# Patient Record
Sex: Male | Born: 1990 | Race: White | Hispanic: No | Marital: Single | Smoking: Current every day smoker
Health system: Southern US, Community
[De-identification: ages and names within clinical notes are randomized; demographics above are authoritative.]

## PROBLEM LIST (undated history)

## (undated) DIAGNOSIS — G8929 Other chronic pain: Secondary | ICD-10-CM

## (undated) DIAGNOSIS — F4310 Post-traumatic stress disorder, unspecified: Secondary | ICD-10-CM

## (undated) DIAGNOSIS — M545 Low back pain, unspecified: Secondary | ICD-10-CM

## (undated) DIAGNOSIS — F329 Major depressive disorder, single episode, unspecified: Secondary | ICD-10-CM

## (undated) DIAGNOSIS — K219 Gastro-esophageal reflux disease without esophagitis: Secondary | ICD-10-CM

## (undated) DIAGNOSIS — F909 Attention-deficit hyperactivity disorder, unspecified type: Secondary | ICD-10-CM

## (undated) DIAGNOSIS — M1990 Unspecified osteoarthritis, unspecified site: Secondary | ICD-10-CM

## (undated) DIAGNOSIS — S069X9A Unspecified intracranial injury with loss of consciousness of unspecified duration, initial encounter: Secondary | ICD-10-CM

## (undated) DIAGNOSIS — F419 Anxiety disorder, unspecified: Secondary | ICD-10-CM

## (undated) DIAGNOSIS — F32A Depression, unspecified: Secondary | ICD-10-CM

## (undated) DIAGNOSIS — T8484XA Pain due to internal orthopedic prosthetic devices, implants and grafts, initial encounter: Secondary | ICD-10-CM

## (undated) DIAGNOSIS — Z9289 Personal history of other medical treatment: Secondary | ICD-10-CM

## (undated) DIAGNOSIS — F319 Bipolar disorder, unspecified: Secondary | ICD-10-CM

## (undated) HISTORY — PX: FRACTURE SURGERY: SHX138

---

## 2017-05-20 DIAGNOSIS — Z9289 Personal history of other medical treatment: Secondary | ICD-10-CM

## 2017-05-20 HISTORY — DX: Personal history of other medical treatment: Z92.89

## 2017-06-16 ENCOUNTER — Inpatient Hospital Stay (HOSPITAL_COMMUNITY)
Admission: EM | Admit: 2017-06-16 | Discharge: 2017-07-09 | DRG: 957 | Disposition: A | Discharge status: Home Health | Payer: Medicaid Other | Attending: General Surgery | Admitting: General Surgery

## 2017-06-16 ENCOUNTER — Inpatient Hospital Stay (HOSPITAL_COMMUNITY): Payer: Medicaid Other

## 2017-06-16 ENCOUNTER — Emergency Department (HOSPITAL_COMMUNITY): Payer: Medicaid Other

## 2017-06-16 DIAGNOSIS — T8119XA Other postprocedural shock, initial encounter: Secondary | ICD-10-CM | POA: Diagnosis not present

## 2017-06-16 DIAGNOSIS — S82012A Displaced osteochondral fracture of left patella, initial encounter for closed fracture: Secondary | ICD-10-CM | POA: Diagnosis present

## 2017-06-16 DIAGNOSIS — I471 Supraventricular tachycardia: Secondary | ICD-10-CM | POA: Diagnosis present

## 2017-06-16 DIAGNOSIS — S82841B Displaced bimalleolar fracture of right lower leg, initial encounter for open fracture type I or II: Secondary | ICD-10-CM | POA: Diagnosis present

## 2017-06-16 DIAGNOSIS — S92312A Displaced fracture of first metatarsal bone, left foot, initial encounter for closed fracture: Secondary | ICD-10-CM

## 2017-06-16 DIAGNOSIS — T17890A Other foreign object in other parts of respiratory tract causing asphyxiation, initial encounter: Secondary | ICD-10-CM | POA: Diagnosis not present

## 2017-06-16 DIAGNOSIS — S2241XA Multiple fractures of ribs, right side, initial encounter for closed fracture: Secondary | ICD-10-CM | POA: Diagnosis present

## 2017-06-16 DIAGNOSIS — J939 Pneumothorax, unspecified: Secondary | ICD-10-CM

## 2017-06-16 DIAGNOSIS — Z419 Encounter for procedure for purposes other than remedying health state, unspecified: Secondary | ICD-10-CM

## 2017-06-16 DIAGNOSIS — T148XXA Other injury of unspecified body region, initial encounter: Secondary | ICD-10-CM

## 2017-06-16 DIAGNOSIS — R402112 Coma scale, eyes open, never, at arrival to emergency department: Secondary | ICD-10-CM | POA: Diagnosis present

## 2017-06-16 DIAGNOSIS — R58 Hemorrhage, not elsewhere classified: Secondary | ICD-10-CM

## 2017-06-16 DIAGNOSIS — S060X9A Concussion with loss of consciousness of unspecified duration, initial encounter: Secondary | ICD-10-CM | POA: Diagnosis present

## 2017-06-16 DIAGNOSIS — M25462 Effusion, left knee: Secondary | ICD-10-CM | POA: Diagnosis not present

## 2017-06-16 DIAGNOSIS — S129XXA Fracture of neck, unspecified, initial encounter: Secondary | ICD-10-CM

## 2017-06-16 DIAGNOSIS — M2540 Effusion, unspecified joint: Secondary | ICD-10-CM

## 2017-06-16 DIAGNOSIS — I9589 Other hypotension: Secondary | ICD-10-CM

## 2017-06-16 DIAGNOSIS — S32409A Unspecified fracture of unspecified acetabulum, initial encounter for closed fracture: Secondary | ICD-10-CM | POA: Diagnosis present

## 2017-06-16 DIAGNOSIS — S52021B Displaced fracture of olecranon process without intraarticular extension of right ulna, initial encounter for open fracture type I or II: Secondary | ICD-10-CM | POA: Diagnosis present

## 2017-06-16 DIAGNOSIS — S12601A Unspecified nondisplaced fracture of seventh cervical vertebra, initial encounter for closed fracture: Secondary | ICD-10-CM | POA: Diagnosis present

## 2017-06-16 DIAGNOSIS — S12600A Unspecified displaced fracture of seventh cervical vertebra, initial encounter for closed fracture: Secondary | ICD-10-CM

## 2017-06-16 DIAGNOSIS — Y92410 Unspecified street and highway as the place of occurrence of the external cause: Secondary | ICD-10-CM | POA: Diagnosis not present

## 2017-06-16 DIAGNOSIS — S32811A Multiple fractures of pelvis with unstable disruption of pelvic ring, initial encounter for closed fracture: Secondary | ICD-10-CM | POA: Diagnosis present

## 2017-06-16 DIAGNOSIS — R0682 Tachypnea, not elsewhere classified: Secondary | ICD-10-CM

## 2017-06-16 DIAGNOSIS — S92332A Displaced fracture of third metatarsal bone, left foot, initial encounter for closed fracture: Secondary | ICD-10-CM | POA: Diagnosis present

## 2017-06-16 DIAGNOSIS — Z8709 Personal history of other diseases of the respiratory system: Secondary | ICD-10-CM

## 2017-06-16 DIAGNOSIS — S32129A Unspecified Zone II fracture of sacrum, initial encounter for closed fracture: Secondary | ICD-10-CM | POA: Diagnosis present

## 2017-06-16 DIAGNOSIS — S92352A Displaced fracture of fifth metatarsal bone, left foot, initial encounter for closed fracture: Secondary | ICD-10-CM | POA: Diagnosis present

## 2017-06-16 DIAGNOSIS — S92311A Displaced fracture of first metatarsal bone, right foot, initial encounter for closed fracture: Secondary | ICD-10-CM | POA: Diagnosis present

## 2017-06-16 DIAGNOSIS — J9601 Acute respiratory failure with hypoxia: Secondary | ICD-10-CM | POA: Diagnosis not present

## 2017-06-16 DIAGNOSIS — S270XXA Traumatic pneumothorax, initial encounter: Secondary | ICD-10-CM | POA: Diagnosis present

## 2017-06-16 DIAGNOSIS — D72829 Elevated white blood cell count, unspecified: Secondary | ICD-10-CM | POA: Diagnosis not present

## 2017-06-16 DIAGNOSIS — E871 Hypo-osmolality and hyponatremia: Secondary | ICD-10-CM | POA: Diagnosis not present

## 2017-06-16 DIAGNOSIS — J9811 Atelectasis: Secondary | ICD-10-CM | POA: Diagnosis not present

## 2017-06-16 DIAGNOSIS — S36039A Unspecified laceration of spleen, initial encounter: Secondary | ICD-10-CM | POA: Diagnosis present

## 2017-06-16 DIAGNOSIS — S92322A Displaced fracture of second metatarsal bone, left foot, initial encounter for closed fracture: Secondary | ICD-10-CM | POA: Diagnosis present

## 2017-06-16 DIAGNOSIS — X58XXXA Exposure to other specified factors, initial encounter: Secondary | ICD-10-CM | POA: Diagnosis not present

## 2017-06-16 DIAGNOSIS — S82832B Other fracture of upper and lower end of left fibula, initial encounter for open fracture type I or II: Secondary | ICD-10-CM | POA: Diagnosis present

## 2017-06-16 DIAGNOSIS — R402364 Coma scale, best motor response, obeys commands, 24 hours or more after hospital admission: Secondary | ICD-10-CM | POA: Diagnosis not present

## 2017-06-16 DIAGNOSIS — Z9889 Other specified postprocedural states: Secondary | ICD-10-CM

## 2017-06-16 DIAGNOSIS — F17210 Nicotine dependence, cigarettes, uncomplicated: Secondary | ICD-10-CM | POA: Diagnosis present

## 2017-06-16 DIAGNOSIS — Y838 Other surgical procedures as the cause of abnormal reaction of the patient, or of later complication, without mention of misadventure at the time of the procedure: Secondary | ICD-10-CM | POA: Diagnosis not present

## 2017-06-16 DIAGNOSIS — K5900 Constipation, unspecified: Secondary | ICD-10-CM | POA: Diagnosis not present

## 2017-06-16 DIAGNOSIS — E876 Hypokalemia: Secondary | ICD-10-CM | POA: Diagnosis not present

## 2017-06-16 DIAGNOSIS — R451 Restlessness and agitation: Secondary | ICD-10-CM | POA: Diagnosis not present

## 2017-06-16 DIAGNOSIS — J9690 Respiratory failure, unspecified, unspecified whether with hypoxia or hypercapnia: Secondary | ICD-10-CM

## 2017-06-16 DIAGNOSIS — J95811 Postprocedural pneumothorax: Secondary | ICD-10-CM | POA: Diagnosis not present

## 2017-06-16 DIAGNOSIS — S329XXA Fracture of unspecified parts of lumbosacral spine and pelvis, initial encounter for closed fracture: Secondary | ICD-10-CM

## 2017-06-16 DIAGNOSIS — S83242A Other tear of medial meniscus, current injury, left knee, initial encounter: Secondary | ICD-10-CM | POA: Diagnosis present

## 2017-06-16 DIAGNOSIS — Z09 Encounter for follow-up examination after completed treatment for conditions other than malignant neoplasm: Secondary | ICD-10-CM

## 2017-06-16 DIAGNOSIS — R402254 Coma scale, best verbal response, oriented, 24 hours or more after hospital admission: Secondary | ICD-10-CM | POA: Diagnosis not present

## 2017-06-16 DIAGNOSIS — E872 Acidosis, unspecified: Secondary | ICD-10-CM

## 2017-06-16 DIAGNOSIS — Z9689 Presence of other specified functional implants: Secondary | ICD-10-CM

## 2017-06-16 DIAGNOSIS — Z452 Encounter for adjustment and management of vascular access device: Secondary | ICD-10-CM

## 2017-06-16 DIAGNOSIS — I959 Hypotension, unspecified: Secondary | ICD-10-CM | POA: Diagnosis present

## 2017-06-16 DIAGNOSIS — T1490XA Injury, unspecified, initial encounter: Secondary | ICD-10-CM

## 2017-06-16 DIAGNOSIS — S93431A Sprain of tibiofibular ligament of right ankle, initial encounter: Secondary | ICD-10-CM | POA: Diagnosis present

## 2017-06-16 DIAGNOSIS — S92023A Displaced fracture of anterior process of unspecified calcaneus, initial encounter for closed fracture: Secondary | ICD-10-CM | POA: Diagnosis present

## 2017-06-16 DIAGNOSIS — R402144 Coma scale, eyes open, spontaneous, 24 hours or more after hospital admission: Secondary | ICD-10-CM | POA: Diagnosis not present

## 2017-06-16 DIAGNOSIS — F1111 Opioid abuse, in remission: Secondary | ICD-10-CM

## 2017-06-16 DIAGNOSIS — R4189 Other symptoms and signs involving cognitive functions and awareness: Secondary | ICD-10-CM

## 2017-06-16 DIAGNOSIS — Z938 Other artificial opening status: Secondary | ICD-10-CM

## 2017-06-16 DIAGNOSIS — D62 Acute posthemorrhagic anemia: Secondary | ICD-10-CM | POA: Diagnosis present

## 2017-06-16 DIAGNOSIS — S83207A Unspecified tear of unspecified meniscus, current injury, left knee, initial encounter: Secondary | ICD-10-CM | POA: Diagnosis present

## 2017-06-16 DIAGNOSIS — R402312 Coma scale, best motor response, none, at arrival to emergency department: Secondary | ICD-10-CM | POA: Diagnosis present

## 2017-06-16 DIAGNOSIS — Z23 Encounter for immunization: Secondary | ICD-10-CM | POA: Diagnosis not present

## 2017-06-16 DIAGNOSIS — S32009A Unspecified fracture of unspecified lumbar vertebra, initial encounter for closed fracture: Secondary | ICD-10-CM

## 2017-06-16 DIAGNOSIS — F1110 Opioid abuse, uncomplicated: Secondary | ICD-10-CM | POA: Diagnosis present

## 2017-06-16 DIAGNOSIS — R402212 Coma scale, best verbal response, none, at arrival to emergency department: Secondary | ICD-10-CM | POA: Diagnosis present

## 2017-06-16 DIAGNOSIS — S83412A Sprain of medial collateral ligament of left knee, initial encounter: Secondary | ICD-10-CM | POA: Diagnosis present

## 2017-06-16 DIAGNOSIS — R739 Hyperglycemia, unspecified: Secondary | ICD-10-CM | POA: Diagnosis not present

## 2017-06-16 DIAGNOSIS — Z978 Presence of other specified devices: Secondary | ICD-10-CM

## 2017-06-16 DIAGNOSIS — S93325A Dislocation of tarsometatarsal joint of left foot, initial encounter: Secondary | ICD-10-CM | POA: Diagnosis present

## 2017-06-16 DIAGNOSIS — S92342A Displaced fracture of fourth metatarsal bone, left foot, initial encounter for closed fracture: Secondary | ICD-10-CM | POA: Diagnosis present

## 2017-06-16 DIAGNOSIS — G8918 Other acute postprocedural pain: Secondary | ICD-10-CM

## 2017-06-16 DIAGNOSIS — S92323A Displaced fracture of second metatarsal bone, unspecified foot, initial encounter for closed fracture: Secondary | ICD-10-CM | POA: Diagnosis present

## 2017-06-16 DIAGNOSIS — S32810A Multiple fractures of pelvis with stable disruption of pelvic ring, initial encounter for closed fracture: Secondary | ICD-10-CM

## 2017-06-16 DIAGNOSIS — S82891C Other fracture of right lower leg, initial encounter for open fracture type IIIA, IIIB, or IIIC: Secondary | ICD-10-CM

## 2017-06-16 DIAGNOSIS — S86111A Strain of other muscle(s) and tendon(s) of posterior muscle group at lower leg level, right leg, initial encounter: Secondary | ICD-10-CM | POA: Diagnosis present

## 2017-06-16 LAB — BASIC METABOLIC PANEL
Anion gap: 10 (ref 5–15)
BUN: 12 mg/dL (ref 6–20)
CALCIUM: 8 mg/dL — AB (ref 8.9–10.3)
CO2: 25 mmol/L (ref 22–32)
CREATININE: 1.73 mg/dL — AB (ref 0.61–1.24)
Chloride: 101 mmol/L (ref 101–111)
GFR, EST AFRICAN AMERICAN: 26 mL/min — AB (ref >60)
GFR, EST NON AFRICAN AMERICAN: 23 mL/min — AB (ref >60)
Glucose, Bld: 175 mg/dL — ABNORMAL HIGH (ref 65–99)
Potassium: 4.3 mmol/L (ref 3.5–5.1)
SODIUM: 136 mmol/L (ref 135–145)

## 2017-06-16 LAB — I-STAT CHEM 8, ED
BUN: 14 mg/dL (ref 6–20)
CALCIUM ION: 1.07 mmol/L — AB (ref 1.15–1.40)
CHLORIDE: 96 mmol/L — AB (ref 101–111)
Creatinine, Ser: 1.7 mg/dL — ABNORMAL HIGH (ref 0.61–1.24)
Glucose, Bld: 176 mg/dL — ABNORMAL HIGH (ref 65–99)
HEMATOCRIT: 33 % — AB (ref 39.0–52.0)
Hemoglobin: 11.2 g/dL — ABNORMAL LOW (ref 13.0–17.0)
Potassium: 4.5 mmol/L (ref 3.5–5.1)
SODIUM: 136 mmol/L (ref 135–145)
TCO2: 26 mmol/L (ref 22–32)

## 2017-06-16 LAB — I-STAT CG4 LACTIC ACID, ED: LACTIC ACID, VENOUS: 4.18 mmol/L — AB (ref 0.5–1.9)

## 2017-06-16 LAB — CBC
HCT: 34.7 % — ABNORMAL LOW (ref 39.0–52.0)
HEMOGLOBIN: 12 g/dL — AB (ref 13.0–17.0)
MCH: 31.2 pg (ref 26.0–34.0)
MCHC: 34.6 g/dL (ref 30.0–36.0)
MCV: 90.1 fL (ref 78.0–100.0)
PLATELETS: 373 10*3/uL (ref 150–400)
RBC: 3.85 MIL/uL — ABNORMAL LOW (ref 4.22–5.81)
RDW: 14 % (ref 11.5–15.5)
WBC: 19.8 10*3/uL — ABNORMAL HIGH (ref 4.0–10.5)

## 2017-06-16 LAB — PROTIME-INR
INR: 1.37
PROTHROMBIN TIME: 16.8 s — AB (ref 11.4–15.2)

## 2017-06-16 LAB — TRIGLYCERIDES: Triglycerides: 96 mg/dL (ref <150)

## 2017-06-16 MED ORDER — CEFAZOLIN SODIUM-DEXTROSE 1-4 GM/50ML-% IV SOLN
1.0000 g | Freq: Three times a day (TID) | INTRAVENOUS | Status: DC
  Administered 2017-06-16 – 2017-06-17 (×2): 1 g via INTRAVENOUS
  Filled 2017-06-16 (×4): qty 50

## 2017-06-16 MED ORDER — ORAL CARE MOUTH RINSE
15.0000 mL | Freq: Four times a day (QID) | OROMUCOSAL | Status: DC
  Administered 2017-06-17 – 2017-06-18 (×3): 15 mL via OROMUCOSAL

## 2017-06-16 MED ORDER — DEXTROSE IN LACTATED RINGERS 5 % IV SOLN
INTRAVENOUS | Status: DC
  Administered 2017-06-17 – 2017-06-20 (×7): via INTRAVENOUS
  Administered 2017-06-21: 50 mL/h via INTRAVENOUS
  Administered 2017-06-23 – 2017-06-24 (×3): via INTRAVENOUS

## 2017-06-16 MED ORDER — CHLORHEXIDINE GLUCONATE 0.12% ORAL RINSE (MEDLINE KIT)
15.0000 mL | Freq: Two times a day (BID) | OROMUCOSAL | Status: DC
  Administered 2017-06-17 – 2017-06-20 (×8): 15 mL via OROMUCOSAL

## 2017-06-16 MED ORDER — MIDAZOLAM HCL 5 MG/5ML IJ SOLN
INTRAMUSCULAR | Status: AC | PRN
  Administered 2017-06-16: 4 mg via INTRAVENOUS

## 2017-06-16 MED ORDER — MIDAZOLAM HCL 2 MG/2ML IJ SOLN
INTRAMUSCULAR | Status: AC
  Administered 2017-06-16: 5 mg
  Filled 2017-06-16: qty 4

## 2017-06-16 MED ORDER — SUCCINYLCHOLINE CHLORIDE 20 MG/ML IJ SOLN
INTRAMUSCULAR | Status: AC | PRN
  Administered 2017-06-16: 120 mg via INTRAVENOUS

## 2017-06-16 MED ORDER — FENTANYL CITRATE (PF) 100 MCG/2ML IJ SOLN: 50.0000 ug | Freq: Once | INTRAMUSCULAR | Status: DC

## 2017-06-16 MED ORDER — MIDAZOLAM HCL 2 MG/2ML IJ SOLN
5.0000 mg | Freq: Once | INTRAMUSCULAR | Status: AC
  Administered 2017-06-17: 5 mg via INTRAVENOUS

## 2017-06-16 MED ORDER — MIDAZOLAM HCL 2 MG/2ML IJ SOLN
INTRAMUSCULAR | Status: AC
  Filled 2017-06-16: qty 6

## 2017-06-16 MED ORDER — FENTANYL 2500MCG IN NS 250ML (10MCG/ML) PREMIX INFUSION
0.0000 ug/h | INTRAVENOUS | Status: DC
  Administered 2017-06-17: 25 ug/h via INTRAVENOUS
  Administered 2017-06-18: 200 ug/h via INTRAVENOUS
  Administered 2017-06-18: 150 ug/h via INTRAVENOUS
  Administered 2017-06-19: 200 ug/h via INTRAVENOUS
  Administered 2017-06-19: 13:00:00 via INTRAVENOUS
  Administered 2017-06-20 (×2): 200 ug/h via INTRAVENOUS
  Filled 2017-06-16 (×7): qty 250

## 2017-06-16 MED ORDER — PROPOFOL 1000 MG/100ML IV EMUL
5.0000 ug/kg/min | INTRAVENOUS | Status: DC
  Administered 2017-06-16: 20 ug/kg/min via INTRAVENOUS

## 2017-06-16 MED ORDER — TETANUS-DIPHTH-ACELL PERTUSSIS 5-2.5-18.5 LF-MCG/0.5 IM SUSP
0.5000 mL | Freq: Once | INTRAMUSCULAR | Status: AC
  Administered 2017-06-16: 0.5 mL via INTRAMUSCULAR
  Filled 2017-06-16: qty 0.5

## 2017-06-16 MED ORDER — FENTANYL CITRATE (PF) 100 MCG/2ML IJ SOLN
INTRAMUSCULAR | Status: AC
  Administered 2017-06-16: 200 ug
  Filled 2017-06-16: qty 4

## 2017-06-16 MED ORDER — FENTANYL CITRATE (PF) 100 MCG/2ML IJ SOLN
200.0000 ug | Freq: Once | INTRAMUSCULAR | Status: AC
  Administered 2017-06-17: 200 ug via INTRAVENOUS

## 2017-06-16 MED ORDER — ROCURONIUM BROMIDE 50 MG/5ML IV SOLN
INTRAVENOUS | Status: AC | PRN
  Administered 2017-06-16: 50 mg via INTRAVENOUS

## 2017-06-16 MED ORDER — ETOMIDATE 2 MG/ML IV SOLN
INTRAVENOUS | Status: AC | PRN
  Administered 2017-06-16: 20 mg via INTRAVENOUS

## 2017-06-16 MED ORDER — PROPOFOL 1000 MG/100ML IV EMUL
INTRAVENOUS | Status: AC
  Filled 2017-06-16: qty 100

## 2017-06-16 NOTE — ED Notes (Signed)
 [facility]FP infusing via [facility]  Unit # [id number] [phone]

## 2017-06-16 NOTE — ED Notes (Signed)
 RBC infusing via [facility] Unit [phone]
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
## 2017-06-16 NOTE — ED Notes (Signed)
 RBC's infusing via [facility]  Unit # [id number] [phone]

## 2017-06-16 NOTE — ED Notes (Signed)
 RBC's infusing via [place]  Unit #[id number] [phone]

## 2017-06-16 NOTE — ED Notes (Signed)
 [facility]FP infusing via [facility]  Unit # [id number] [phone]

## 2017-06-16 NOTE — ED Provider Notes (Signed)
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 initial encounter  Displaced fracture of first metatarsal bone, left foot, initial encounter for closed fracture  Hypotension due to blood loss    New Prescriptions New Prescriptions   No medications on file     Mike Lento, MD 06/17/17 [id number]    Charlesetta Shanks, MD 07/02/17 [phone]

## 2017-06-16 NOTE — ED Notes (Signed)
Pt to CT

## 2017-06-16 NOTE — ED Notes (Signed)
Pelvic Binder applied by Dr. Derrell Lolling

## 2017-06-16 NOTE — ED Notes (Signed)
Pt moving all extremities in CT, see MAR

## 2017-06-16 NOTE — ED Notes (Signed)
 RBC's infusing via [facility] unit [phone] [id number]

## 2017-06-16 NOTE — Procedures (Signed)
Central Venous Catheter Insertion Procedure Note Orlie Pollen [id number] 10/20/1876  Procedure: Insertion of Central Venous Catheter Indications: Drug and/or fluid administration  Procedure Details Consent: Unable to obtain consent because of emergent medical necessity. Time Out: Verified patient identification, verified procedure, site/side was marked, verified correct patient position, special equipment/implants available, medications/allergies/relevent history reviewed, required imaging and test results available.  Performed  Maximum sterile technique was used including antiseptics and gloves. Skin prep: Iodine solution; local anesthetic administered A antimicrobial bonded/coated triple lumen catheter was placed in the right femoral vein due to emergent situation using the Seldinger technique.  Evaluation Blood flow good Complications: No apparent complications Patient did tolerate procedure well. Chest X-ray ordered to verify placement.  CXR: not needed.  Marigene Ehlers., Iretta Mangrum 06/16/2017, 10:41 PM

## 2017-06-16 NOTE — ED Notes (Signed)
 FFP infusing via [facility]   Unit #  [id number] [phone]

## 2017-06-16 NOTE — ED Notes (Signed)
Heat turned up in room 

## 2017-06-16 NOTE — ED Notes (Signed)
2nd liter NS bolus infusing

## 2017-06-16 NOTE — ED Notes (Signed)
 FFP's infusing via [facility]  Unit # [id number] [phone]

## 2017-06-16 NOTE — ED Notes (Signed)
Port x-ray at bedside at this time.  Pt remains unresponsive at this time

## 2017-06-16 NOTE — ED Notes (Signed)
Warm blankets applied to pt.  

## 2017-06-16 NOTE — Procedures (Signed)
Chest Tube Insertion Procedure Note  Indications:  Clinically significant hypotension/PTX  Pre-operative Diagnosis: Pneumothorax  Post-operative Diagnosis: Pneumothorax  Procedure Details  Informed consent was obtained for the procedure, including sedation.  Risks of lung perforation, hemorrhage, arrhythmia, and adverse drug reaction were discussed.   After sterile skin prep, using standard technique, a 36 French tube was placed in the right anterior 5 rib space.  Findings: None  Estimated Blood Loss:  Minimal         Specimens:  None              Complications:  None; patient tolerated the procedure well.         Disposition: ICU - intubated and critically ill.         Condition: stable  Attending Attestation: I performed the procedure.

## 2017-06-17 ENCOUNTER — Inpatient Hospital Stay (HOSPITAL_COMMUNITY): Payer: Medicaid Other | Admitting: Anesthesiology

## 2017-06-17 ENCOUNTER — Inpatient Hospital Stay (HOSPITAL_COMMUNITY): Payer: Medicaid Other

## 2017-06-17 ENCOUNTER — Encounter (HOSPITAL_COMMUNITY): Admission: EM | Disposition: A | Discharge status: Home Health | Payer: Self-pay | Source: Home / Self Care

## 2017-06-17 HISTORY — PX: ORIF ELBOW FRACTURE: SHX5031

## 2017-06-17 HISTORY — PX: EXTERNAL FIXATION LEG: SHX1549

## 2017-06-17 HISTORY — PX: PERCUTANEOUS PINNING: SHX2209

## 2017-06-17 LAB — TYPE AND SCREEN
ABO/RH(D): A POS
Antibody Screen: NEGATIVE
UNIT DIVISION: 0
UNIT DIVISION: 0
UNIT DIVISION: 0
UNIT DIVISION: 0
UNIT DIVISION: 0
UNIT DIVISION: 0
Unit division: 0
Unit division: 0
Unit division: 0

## 2017-06-17 LAB — BPAM RBC
BLOOD PRODUCT EXPIRATION DATE: 201809112359
BLOOD PRODUCT EXPIRATION DATE: 201809132359
BLOOD PRODUCT EXPIRATION DATE: 201809142359
BLOOD PRODUCT EXPIRATION DATE: 201809202359
Blood Product Expiration Date: 201809042359
Blood Product Expiration Date: 201809142359
Blood Product Expiration Date: 201809202359
Blood Product Expiration Date: 201809202359
Blood Product Expiration Date: 201809202359
ISSUE DATE / TIME: 201808282036
ISSUE DATE / TIME: 201808282107
ISSUE DATE / TIME: 201808291139
ISSUE DATE / TIME: 201808291139
ISSUE DATE / TIME: 201808282036
ISSUE DATE / TIME: 201808282107
UNIT TYPE AND RH: 6200
UNIT TYPE AND RH: 6200
UNIT TYPE AND RH: 6200
UNIT TYPE AND RH: 9500
Unit Type and Rh: 6200
Unit Type and Rh: 6200
Unit Type and Rh: 9500
Unit Type and Rh: 9500
Unit Type and Rh: 9500

## 2017-06-17 LAB — PREPARE FRESH FROZEN PLASMA
UNIT DIVISION: 0
Unit division: 0
Unit division: 0
Unit division: 0

## 2017-06-17 LAB — RAPID URINE DRUG SCREEN, HOSP PERFORMED
AMPHETAMINES: NOT DETECTED
BARBITURATES: NOT DETECTED
Benzodiazepines: POSITIVE — AB
Cocaine: NOT DETECTED
Opiates: POSITIVE — AB
TETRAHYDROCANNABINOL: NOT DETECTED

## 2017-06-17 LAB — BASIC METABOLIC PANEL
Anion gap: 7 (ref 5–15)
BUN: 11 mg/dL (ref 6–20)
CALCIUM: 7.2 mg/dL — AB (ref 8.9–10.3)
CO2: 23 mmol/L (ref 22–32)
CREATININE: 1.48 mg/dL — AB (ref 0.61–1.24)
Chloride: 109 mmol/L (ref 101–111)
GFR calc Af Amer: >60 mL/min (ref >60)
Glucose, Bld: 186 mg/dL — ABNORMAL HIGH (ref 65–99)
Potassium: 4 mmol/L (ref 3.5–5.1)
SODIUM: 139 mmol/L (ref 135–145)

## 2017-06-17 LAB — PREPARE RBC (CROSSMATCH)

## 2017-06-17 LAB — CBC
HEMATOCRIT: 29 % — AB (ref 39.0–52.0)
HEMOGLOBIN: 10.2 g/dL — AB (ref 13.0–17.0)
MCH: 29.8 pg (ref 26.0–34.0)
MCHC: 35.2 g/dL (ref 30.0–36.0)
MCV: 84.8 fL (ref 78.0–100.0)
Platelets: 145 10*3/uL — ABNORMAL LOW (ref 150–400)
RBC: 3.42 MIL/uL — AB (ref 4.22–5.81)
RDW: 15.5 % (ref 11.5–15.5)
WBC: 18.3 10*3/uL — ABNORMAL HIGH (ref 4.0–10.5)

## 2017-06-17 LAB — BPAM FFP
BLOOD PRODUCT EXPIRATION DATE: 201808312359
Blood Product Expiration Date: 201808292359
Blood Product Expiration Date: 201809122359
Blood Product Expiration Date: 201809172359
ISSUE DATE / TIME: 201808282036
ISSUE DATE / TIME: 201808282036
ISSUE DATE / TIME: 201808282108
ISSUE DATE / TIME: 201808282108
UNIT TYPE AND RH: 6200
UNIT TYPE AND RH: 6200
Unit Type and Rh: 6200
Unit Type and Rh: 6200

## 2017-06-17 LAB — POCT I-STAT 3, ART BLOOD GAS (G3+)
Acid-base deficit: 4 mmol/L — ABNORMAL HIGH (ref 0.0–2.0)
BICARBONATE: 22.1 mmol/L (ref 20.0–28.0)
O2 Saturation: 100 %
PO2 ART: 188 mmHg — AB (ref 83.0–108.0)
Patient temperature: 99.1
TCO2: 23 mmol/L (ref 22–32)
pCO2 arterial: 45.4 mmHg (ref 32.0–48.0)
pH, Arterial: 7.297 — ABNORMAL LOW (ref 7.350–7.450)

## 2017-06-17 LAB — BLOOD GAS, ARTERIAL
Acid-base deficit: 2.3 mmol/L — ABNORMAL HIGH (ref 0.0–2.0)
Acid-base deficit: 0.6 mmol/L (ref 0.0–2.0)
BICARBONATE: 22.4 mmol/L (ref 20.0–28.0)
Bicarbonate: 23.7 mmol/L (ref 20.0–28.0)
DRAWN BY: 301361
Drawn by: 30136
FIO2: 40
FIO2: 40
LHR: 14 {breaths}/min
LHR: 14 {breaths}/min
MECHVT: 600 mL
O2 SAT: 99.3 %
O2 Saturation: 99.1 %
PATIENT TEMPERATURE: 98.6
PCO2 ART: 40.6 mmHg (ref 32.0–48.0)
PEEP/CPAP: 5 cmH2O
PEEP: 5 cmH2O
PH ART: 7.385 (ref 7.350–7.450)
PO2 ART: 183 mmHg — AB (ref 83.0–108.0)
Patient temperature: 100
VT: 600 mL
pCO2 arterial: 42.9 mmHg (ref 32.0–48.0)
pH, Arterial: 7.342 — ABNORMAL LOW (ref 7.350–7.450)
pO2, Arterial: 182 mmHg — ABNORMAL HIGH (ref 83.0–108.0)

## 2017-06-17 LAB — ABO/RH: ABO/RH(D): A POS

## 2017-06-17 LAB — BLOOD PRODUCT ORDER (VERBAL) VERIFICATION

## 2017-06-17 LAB — SURGICAL PCR SCREEN
MRSA, PCR: NEGATIVE
Staphylococcus aureus: POSITIVE — AB

## 2017-06-17 LAB — PROTIME-INR
INR: 1.37
PROTHROMBIN TIME: 16.8 s — AB (ref 11.4–15.2)

## 2017-06-17 LAB — LACTIC ACID, PLASMA: LACTIC ACID, VENOUS: 2.9 mmol/L — AB (ref 0.5–1.9)

## 2017-06-17 LAB — MRSA PCR SCREENING: MRSA by PCR: NEGATIVE

## 2017-06-17 SURGERY — EXTERNAL FIXATION, LOWER EXTREMITY
Anesthesia: General | Site: Pelvis | Laterality: Right

## 2017-06-17 MED ORDER — PANTOPRAZOLE SODIUM 40 MG IV SOLR
40.0000 mg | INTRAVENOUS | Status: DC
  Administered 2017-06-17 – 2017-06-20 (×4): 40 mg via INTRAVENOUS
  Filled 2017-06-17 (×4): qty 40

## 2017-06-17 MED ORDER — CEFAZOLIN SODIUM-DEXTROSE 2-4 GM/100ML-% IV SOLN
INTRAVENOUS | Status: AC
  Filled 2017-06-17: qty 100

## 2017-06-17 MED ORDER — 0.9 % SODIUM CHLORIDE (POUR BTL) OPTIME
TOPICAL | Status: DC | PRN
  Administered 2017-06-17: 1000 mL

## 2017-06-17 MED ORDER — SODIUM CHLORIDE 0.9 % IV SOLN: Freq: Once | INTRAVENOUS | Status: DC

## 2017-06-17 MED ORDER — PHENYLEPHRINE 40 MCG/ML (10ML) SYRINGE FOR IV PUSH (FOR BLOOD PRESSURE SUPPORT)
PREFILLED_SYRINGE | INTRAVENOUS | Status: AC
  Filled 2017-06-17: qty 10

## 2017-06-17 MED ORDER — BACITRACIN ZINC 500 UNIT/GM EX OINT
TOPICAL_OINTMENT | CUTANEOUS | Status: AC
  Filled 2017-06-17: qty 28.35

## 2017-06-17 MED ORDER — ROCURONIUM BROMIDE 100 MG/10ML IV SOLN
INTRAVENOUS | Status: DC | PRN
  Administered 2017-06-17 (×4): 50 mg via INTRAVENOUS

## 2017-06-17 MED ORDER — SODIUM CHLORIDE 0.9 % IV SOLN: INTRAVENOUS | Status: DC | PRN

## 2017-06-17 MED ORDER — ALBUMIN HUMAN 5 % IV SOLN
25.0000 g | Freq: Once | INTRAVENOUS | Status: AC
  Administered 2017-06-17: 25 g via INTRAVENOUS
  Filled 2017-06-17: qty 500

## 2017-06-17 MED ORDER — ROCURONIUM BROMIDE 10 MG/ML (PF) SYRINGE
PREFILLED_SYRINGE | INTRAVENOUS | Status: AC
  Filled 2017-06-17: qty 5

## 2017-06-17 MED ORDER — SUGAMMADEX SODIUM 200 MG/2ML IV SOLN
INTRAVENOUS | Status: AC
  Filled 2017-06-17: qty 2

## 2017-06-17 MED ORDER — MIDAZOLAM HCL 2 MG/2ML IJ SOLN
INTRAMUSCULAR | Status: AC
  Filled 2017-06-17: qty 6

## 2017-06-17 MED ORDER — FENTANYL CITRATE (PF) 250 MCG/5ML IJ SOLN
INTRAMUSCULAR | Status: DC | PRN
  Administered 2017-06-17: 250 ug via INTRAVENOUS
  Administered 2017-06-17: 50 ug via INTRAVENOUS
  Administered 2017-06-17: 200 ug via INTRAVENOUS
  Administered 2017-06-17: 150 ug via INTRAVENOUS
  Administered 2017-06-17: 100 ug via INTRAVENOUS

## 2017-06-17 MED ORDER — SODIUM CHLORIDE 0.9 % IV SOLN: 10.0000 mL/h | Freq: Once | INTRAVENOUS | Status: DC

## 2017-06-17 MED ORDER — FENTANYL CITRATE (PF) 250 MCG/5ML IJ SOLN
INTRAMUSCULAR | Status: AC
  Filled 2017-06-17: qty 5

## 2017-06-17 MED ORDER — LIDOCAINE 2% (20 MG/ML) 5 ML SYRINGE
INTRAMUSCULAR | Status: AC
  Filled 2017-06-17: qty 5

## 2017-06-17 MED ORDER — SODIUM CHLORIDE 0.9 % IR SOLN
Status: DC | PRN
  Administered 2017-06-17 (×2): 3000 mL

## 2017-06-17 MED ORDER — CEFAZOLIN SODIUM-DEXTROSE 2-3 GM-% IV SOLR
INTRAVENOUS | Status: DC | PRN
  Administered 2017-06-17 (×2): 2 g via INTRAVENOUS

## 2017-06-17 MED ORDER — DEXTROSE 5 % IN LACTATED RINGERS IV BOLUS
1000.0000 mL | Freq: Once | INTRAVENOUS | Status: AC
  Administered 2017-06-17: 1000 mL via INTRAVENOUS

## 2017-06-17 MED ORDER — EPHEDRINE 5 MG/ML INJ
INTRAVENOUS | Status: AC
  Filled 2017-06-17: qty 10

## 2017-06-17 MED ORDER — MIDAZOLAM HCL 2 MG/2ML IJ SOLN
INTRAMUSCULAR | Status: DC | PRN
  Administered 2017-06-17: 2 mg via INTRAVENOUS

## 2017-06-17 MED ORDER — LACTATED RINGERS IV SOLN
INTRAVENOUS | Status: DC | PRN
  Administered 2017-06-17 (×2): via INTRAVENOUS

## 2017-06-17 MED ORDER — ONDANSETRON HCL 4 MG/2ML IJ SOLN
INTRAMUSCULAR | Status: AC
  Filled 2017-06-17: qty 2

## 2017-06-17 MED ORDER — CEFAZOLIN SODIUM-DEXTROSE 1-4 GM/50ML-% IV SOLN
1.0000 g | Freq: Three times a day (TID) | INTRAVENOUS | Status: AC
  Administered 2017-06-17 – 2017-06-19 (×6): 1 g via INTRAVENOUS
  Filled 2017-06-17 (×7): qty 50

## 2017-06-17 MED ORDER — VANCOMYCIN HCL 1000 MG IV SOLR
INTRAVENOUS | Status: AC
  Filled 2017-06-17: qty 1000

## 2017-06-17 MED ORDER — VANCOMYCIN HCL 1000 MG IV SOLR
INTRAVENOUS | Status: DC | PRN
  Administered 2017-06-17: 1000 mg via TOPICAL

## 2017-06-17 MED ORDER — FENTANYL CITRATE (PF) 250 MCG/5ML IJ SOLN
INTRAMUSCULAR | Status: AC
  Filled 2017-06-17: qty 10

## 2017-06-17 MED ORDER — ROCURONIUM BROMIDE 10 MG/ML (PF) SYRINGE
PREFILLED_SYRINGE | INTRAVENOUS | Status: AC
  Filled 2017-06-17: qty 10

## 2017-06-17 MED ORDER — MIDAZOLAM HCL 50 MG/10ML IJ SOLN
0.0000 mg/h | INTRAMUSCULAR | Status: DC
  Administered 2017-06-17 – 2017-06-18 (×2): 0.5 mg/h via INTRAVENOUS
  Filled 2017-06-17 (×3): qty 10

## 2017-06-17 MED ORDER — PHENYLEPHRINE HCL 10 MG/ML IJ SOLN
INTRAMUSCULAR | Status: DC | PRN
  Administered 2017-06-17: 120 ug via INTRAVENOUS
  Administered 2017-06-17: 80 ug via INTRAVENOUS

## 2017-06-17 MED ORDER — MIDAZOLAM HCL 2 MG/2ML IJ SOLN
INTRAMUSCULAR | Status: AC
  Filled 2017-06-17: qty 2

## 2017-06-17 MED ORDER — FENTANYL CITRATE (PF) 100 MCG/2ML IJ SOLN
INTRAMUSCULAR | Status: AC
  Filled 2017-06-17: qty 4

## 2017-06-17 SURGICAL SUPPLY — 143 items
BANDAGE ACE 3X5.8 VEL STRL LF (GAUZE/BANDAGES/DRESSINGS) ×6 IMPLANT
BANDAGE ACE 4X5 VEL STRL LF (GAUZE/BANDAGES/DRESSINGS) ×6 IMPLANT
BANDAGE ACE 6X5 VEL STRL LF (GAUZE/BANDAGES/DRESSINGS) ×6 IMPLANT
BANDAGE ELASTIC 4 VELCRO ST LF (GAUZE/BANDAGES/DRESSINGS) ×6 IMPLANT
BANDAGE ESMARK 6X9 LF (GAUZE/BANDAGES/DRESSINGS) ×5 IMPLANT
BENZOIN TINCTURE PRP APPL 2/3 (GAUZE/BANDAGES/DRESSINGS) IMPLANT
BIT DRILL 2.5X110 QC LCP DISP (BIT) ×6 IMPLANT
BIT DRILL QC 3.5X195 (BIT) ×6 IMPLANT
BLADE AVERAGE 25X9 (BLADE) ×6 IMPLANT
BLADE CLIPPER SURG (BLADE) ×6 IMPLANT
BLADE SURG 10 STRL SS (BLADE) IMPLANT
BLADE SURG 15 STRL LF DISP TIS (BLADE) ×5 IMPLANT
BLADE SURG 15 STRL SS (BLADE) ×1
BNDG COHESIVE 4X5 TAN STRL (GAUZE/BANDAGES/DRESSINGS) ×6 IMPLANT
BNDG COHESIVE 6X5 TAN STRL LF (GAUZE/BANDAGES/DRESSINGS) IMPLANT
BNDG ESMARK 4X9 LF (GAUZE/BANDAGES/DRESSINGS) ×6 IMPLANT
BNDG ESMARK 6X9 LF (GAUZE/BANDAGES/DRESSINGS) ×6
BNDG GAUZE ELAST 4 BULKY (GAUZE/BANDAGES/DRESSINGS) ×6 IMPLANT
BRUSH SCRUB SURG 4.25 DISP (MISCELLANEOUS) ×12 IMPLANT
CHLORAPREP W/TINT 26ML (MISCELLANEOUS) ×24 IMPLANT
CLAMP ADJUSTABLE (Clamp) ×12 IMPLANT
CLAMP LG COMBINATION (Clamp) ×12 IMPLANT
CLAMP LG MULTI PIN (Clamp) ×6 IMPLANT
CLAMP ROD ATTACHMENT (Clamp) ×6 IMPLANT
CLEANER TIP ELECTROSURG 2X2 (MISCELLANEOUS) ×6 IMPLANT
COVER SURGICAL LIGHT HANDLE (MISCELLANEOUS) ×12 IMPLANT
CUFF TOURNIQUET SINGLE 18IN (TOURNIQUET CUFF) IMPLANT
CUFF TOURNIQUET SINGLE 24IN (TOURNIQUET CUFF) IMPLANT
DECANTER SPIKE VIAL GLASS SM (MISCELLANEOUS) IMPLANT
DRAPE C-ARM 42X72 X-RAY (DRAPES) ×18 IMPLANT
DRAPE C-ARMOR (DRAPES) ×12 IMPLANT
DRAPE HALF SHEET 40X57 (DRAPES) ×6 IMPLANT
DRAPE IMP U-DRAPE 54X76 (DRAPES) ×12 IMPLANT
DRAPE INCISE IOBAN 66X45 STRL (DRAPES) ×6 IMPLANT
DRAPE LAPAROTOMY TRNSV 102X78 (DRAPE) ×6 IMPLANT
DRAPE U-SHAPE 47X51 STRL (DRAPES) ×18 IMPLANT
DRESSING PEEL AND PLC PRVNA 13 (GAUZE/BANDAGES/DRESSINGS) ×5 IMPLANT
DRSG ADAPTIC 3X8 NADH LF (GAUZE/BANDAGES/DRESSINGS) ×12 IMPLANT
DRSG EMULSION OIL 3X3 NADH (GAUZE/BANDAGES/DRESSINGS) IMPLANT
DRSG MEPITEL 4X7.2 (GAUZE/BANDAGES/DRESSINGS) IMPLANT
DRSG PAD ABDOMINAL 8X10 ST (GAUZE/BANDAGES/DRESSINGS) ×12 IMPLANT
DRSG PEEL AND PLACE PREVENA 13 (GAUZE/BANDAGES/DRESSINGS) ×6
ELECT REM PT RETURN 9FT ADLT (ELECTROSURGICAL) ×6
ELECTRODE REM PT RTRN 9FT ADLT (ELECTROSURGICAL) ×5 IMPLANT
EVACUATOR 1/8 PVC DRAIN (DRAIN) IMPLANT
FACESHIELD WRAPAROUND (MASK) IMPLANT
FLUID NSS /IRRIG 3000 ML XXX (IV SOLUTION) ×12 IMPLANT
GAUZE SPONGE 4X4 12PLY STRL (GAUZE/BANDAGES/DRESSINGS) ×6 IMPLANT
GAUZE SPONGE 4X4 12PLY STRL LF (GAUZE/BANDAGES/DRESSINGS) ×12 IMPLANT
GAUZE XEROFORM 1X8 LF (GAUZE/BANDAGES/DRESSINGS) ×6 IMPLANT
GAUZE XEROFORM 5X9 LF (GAUZE/BANDAGES/DRESSINGS) ×6 IMPLANT
GLOVE BIO SURGEON STRL SZ7.5 (GLOVE) ×18 IMPLANT
GLOVE BIO SURGEON STRL SZ8 (GLOVE) ×12 IMPLANT
GLOVE BIOGEL PI IND STRL 6.5 (GLOVE) ×25 IMPLANT
GLOVE BIOGEL PI IND STRL 7.0 (GLOVE) ×5 IMPLANT
GLOVE BIOGEL PI IND STRL 7.5 (GLOVE) ×10 IMPLANT
GLOVE BIOGEL PI IND STRL 8 (GLOVE) ×10 IMPLANT
GLOVE BIOGEL PI INDICATOR 6.5 (GLOVE) ×5
GLOVE BIOGEL PI INDICATOR 7.0 (GLOVE) ×1
GLOVE BIOGEL PI INDICATOR 7.5 (GLOVE) ×2
GLOVE BIOGEL PI INDICATOR 8 (GLOVE) ×2
GLOVE SKINSENSE NS SZ6.5 (GLOVE) ×1
GLOVE SKINSENSE NS SZ7.0 (GLOVE) ×1
GLOVE SKINSENSE STRL SZ6.0 (GLOVE) ×18 IMPLANT
GLOVE SKINSENSE STRL SZ6.5 (GLOVE) ×5 IMPLANT
GLOVE SKINSENSE STRL SZ7.0 (GLOVE) ×5 IMPLANT
GOWN STRL REUS W/ TWL LRG LVL3 (GOWN DISPOSABLE) ×25 IMPLANT
GOWN STRL REUS W/ TWL XL LVL3 (GOWN DISPOSABLE) ×10 IMPLANT
GOWN STRL REUS W/TWL LRG LVL3 (GOWN DISPOSABLE) ×5
GOWN STRL REUS W/TWL XL LVL3 (GOWN DISPOSABLE) ×2
GUIDEWARE NON THREAD 1.25X150 (WIRE) ×6
GUIDEWIRE NON THREAD 1.25X150 (WIRE) ×5 IMPLANT
HANDPIECE INTERPULSE COAX TIP (DISPOSABLE) ×2
K-WIRE 1.6X150 (WIRE) ×18
KIT BASIN OR (CUSTOM PROCEDURE TRAY) ×6 IMPLANT
KIT ROOM TURNOVER OR (KITS) ×6 IMPLANT
KWIRE 1.6X150 (WIRE) ×15 IMPLANT
MANIFOLD NEPTUNE II (INSTRUMENTS) ×6 IMPLANT
NEEDLE 22X1 1/2 (OR ONLY) (NEEDLE) IMPLANT
NS IRRIG 1000ML POUR BTL (IV SOLUTION) ×6 IMPLANT
PACK GENERAL/GYN (CUSTOM PROCEDURE TRAY) IMPLANT
PACK ORTHO EXTREMITY (CUSTOM PROCEDURE TRAY) ×6 IMPLANT
PACK UNIVERSAL I (CUSTOM PROCEDURE TRAY) ×6 IMPLANT
PAD ABD 8X10 STRL (GAUZE/BANDAGES/DRESSINGS) ×6 IMPLANT
PAD ARMBOARD 7.5X6 YLW CONV (MISCELLANEOUS) ×12 IMPLANT
PAD CAST 4YDX4 CTTN HI CHSV (CAST SUPPLIES) ×5 IMPLANT
PADDING CAST COTTON 4X4 STRL (CAST SUPPLIES) ×1
PADDING CAST COTTON 6X4 STRL (CAST SUPPLIES) ×12 IMPLANT
PILLOW ABDUCTION HIP (SOFTGOODS) IMPLANT
PLATE OLECRANON RT 2.7/3.5 2H (Plate) ×6 IMPLANT
ROD CRBN FBR LRG EX-FX 11X150 (Rod) ×6 IMPLANT
ROD CRBN FBR LRG EX-FX 11X200 (Rod) ×6 IMPLANT
ROD CRBN FBR LRG EX-FX 11X400 (Rod) ×6 IMPLANT
SCREW CORTEX 3.5 20MM (Screw) ×1 IMPLANT
SCREW CORTEX 3.5 22MM (Screw) ×1 IMPLANT
SCREW LOCK CORT ST 3.5X20 (Screw) ×5 IMPLANT
SCREW LOCK CORT ST 3.5X22 (Screw) ×5 IMPLANT
SCREW LOCK T8 22X2.7XST VA (Screw) ×10 IMPLANT
SCREW LOCK T8 24X2.7XSTVA (Screw) ×5 IMPLANT
SCREW LOCK VA ST 2.7X20 (Screw) ×6 IMPLANT
SCREW LOCK VA ST 2.7X26 (Screw) ×6 IMPLANT
SCREW LOCKING 2.7X16MM VA (Screw) ×6 IMPLANT
SCREW LOCKING 2.7X22MM (Screw) ×2 IMPLANT
SCREW LOCKING 2.7X24MM (Screw) ×1 IMPLANT
SCREW LOCKING 2.7X28 (Screw) ×6 IMPLANT
SCREW LOCKING 2.7X44MM VA (Screw) ×6 IMPLANT
SCREW METAPHYSEAL 2.7X24MM (Screw) ×6 IMPLANT
SCREW SCHNZ SD 5.0 60 THRD/150 (Screw) ×10 IMPLANT
SCRW SCHANZ SD 5.0 60 THRD/150 (Screw) ×12 IMPLANT
SET HNDPC FAN SPRY TIP SCT (DISPOSABLE) ×10 IMPLANT
SPLINT PLASTER CAST XFAST 5X30 (CAST SUPPLIES) ×5 IMPLANT
SPLINT PLASTER XFAST SET 5X30 (CAST SUPPLIES) ×1
SPONGE LAP 18X18 X RAY DECT (DISPOSABLE) ×18 IMPLANT
STAPLER VISISTAT 35W (STAPLE) ×6 IMPLANT
STOCKINETTE IMPERVIOUS LG (DRAPES) IMPLANT
STRIP CLOSURE SKIN 1/2X4 (GAUZE/BANDAGES/DRESSINGS) IMPLANT
SUCTION FRAZIER HANDLE 10FR (MISCELLANEOUS) ×1
SUCTION FRAZIER TIP 10 FR DISP (SUCTIONS) ×6 IMPLANT
SUCTION TUBE FRAZIER 10FR DISP (MISCELLANEOUS) ×5 IMPLANT
SUT ETHILON 3 0 PS 1 (SUTURE) ×24 IMPLANT
SUT ETHILON 4 0 P 3 18 (SUTURE) IMPLANT
SUT ETHILON 5 0 P 3 18 (SUTURE)
SUT MON AB 2-0 CT1 36 (SUTURE) ×18 IMPLANT
SUT NYLON ETHILON 5-0 P-3 1X18 (SUTURE) IMPLANT
SUT PDS AB 0 CT 36 (SUTURE) ×6 IMPLANT
SUT PDS AB 2-0 CT1 27 (SUTURE) IMPLANT
SUT PROLENE 3 0 PS 2 (SUTURE) IMPLANT
SUT PROLENE 4 0 P 3 18 (SUTURE) IMPLANT
SUT SILK 3 0 (SUTURE) ×1
SUT SILK 3-0 18XBRD TIE 12 (SUTURE) ×5 IMPLANT
SUT VIC AB 0 CT1 27 (SUTURE)
SUT VIC AB 0 CT1 27XBRD ANBCTR (SUTURE) IMPLANT
SUT VIC AB 2-0 CT1 27 (SUTURE)
SUT VIC AB 2-0 CT1 TAPERPNT 27 (SUTURE) IMPLANT
SUT VIC AB 2-0 CT3 27 (SUTURE) IMPLANT
SUT VIC AB 2-0 FS1 27 (SUTURE) IMPLANT
SYR CONTROL 10ML LL (SYRINGE) ×6 IMPLANT
TOWEL OR 17X24 6PK STRL BLUE (TOWEL DISPOSABLE) ×12 IMPLANT
TOWEL OR 17X26 10 PK STRL BLUE (TOWEL DISPOSABLE) ×12 IMPLANT
TUBE CONNECTING 12X1/4 (SUCTIONS) ×18 IMPLANT
UNDERPAD 30X30 (UNDERPADS AND DIAPERS) ×6 IMPLANT
WATER STERILE IRR 1000ML POUR (IV SOLUTION) ×6 IMPLANT
YANKAUER SUCT BULB TIP NO VENT (SUCTIONS) ×6 IMPLANT
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
## 2017-06-17 NOTE — Transfer of Care (Signed)
Immediate Anesthesia Transfer of Care Note  Patient: Mike CoronaDerek B Evelyn  Procedure(s) Performed: Procedure(s): EXTERNAL FIXATION Right  ANKLE with  irrigation and debridement, closed reduction (Right) OPEN REDUCTION INTERNAL FIXATION (ORIF) ELBOW/OLECRANON FRACTURE (Right) IRRIGATION AND DEBRIDEMENT WITH PERCUTANEOUS PINNING Left FOOT, and closed reduction with vac placement (Left) EXAM UNDER ANESTHESIA PELVIS (Bilateral)  Patient Location: ICU  Anesthesia Type:General  Level of Consciousness: sedated and unresponsive  Airway & Oxygen Therapy: Patient remains intubated per anesthesia plan and Patient placed on Ventilator (see vital sign flow sheet for setting)  Post-op Assessment: Report given to RN and Post -op Vital signs reviewed and stable  Post vital signs: Reviewed and stable  Last Vitals:  Vitals:   06/17/17 1000 06/17/17 1627  BP:  (!) 174/72  Pulse: (!) 124 (!) 105  Resp: 15 17  Temp: 37.7 C   SpO2: 100% 97%    Last Pain:  Vitals:   06/16/17 2059  TempSrc: Tympanic         Complications: No apparent anesthesia complications

## 2017-06-17 NOTE — Progress Notes (Signed)
Patient in OR for 1200 vent check.

## 2017-06-17 NOTE — Anesthesia Procedure Notes (Signed)
Central Venous Catheter Insertion Performed by: Heather RobertsSINGER, Mushka Laconte, anesthesiologist Start/End8/29/2018 4:01 PM, 06/17/2017 4:11 PM Patient location: Pre-op. Preanesthetic checklist: patient identified, IV checked, site marked, risks and benefits discussed, surgical consent, monitors and equipment checked, pre-op evaluation, timeout performed and anesthesia consent Position: Trendelenburg Lidocaine 1% used for infiltration and patient sedated Hand hygiene performed , maximum sterile barriers used  and Seldinger technique used Catheter size: 8 Fr Total catheter length 16. Central line was placed.Double lumen Procedure performed without using ultrasound guided technique. Attempts: 1 Following insertion, dressing applied, line sutured and Biopatch. Post procedure assessment: blood return through all ports, free fluid flow and no air  Patient tolerated the procedure well with no immediate complications.

## 2017-06-17 NOTE — OR Nursing (Signed)
White stone stud earring transferred to [employer] nurse secretary to give to floor nurse.

## 2017-06-17 NOTE — ED Notes (Signed)
Per order of magistrate, blood drawn for [employer].

## 2017-06-17 NOTE — OR Nursing (Signed)
Earring given to patients nurse TK Char RN by Octavia Brucknerhonda Felts nurse secretary
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
## 2017-06-17 NOTE — OR Nursing (Signed)
Bilateral chest tubes to wall suction after patient positioned
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
## 2017-06-17 NOTE — Progress Notes (Signed)
Orthopedic Tech Progress Note Patient Details:  Mike CoronaDerek B Pyle 11/18/1990 [id number]  Musculoskeletal Traction Type of Traction: Skeletal (Balanced Suspension) Traction Location: RLE Traction Weight: 20 lbs    Jennye MoccasinHughes, Malcolm Hetz Craig 06/17/2017, 8:08 PM

## 2017-06-17 NOTE — Procedures (Signed)
Arterial Catheter Insertion Procedure Note Mike [name] [id number] March 29, 1991  Procedure: Insertion of Arterial Catheter  Indications: Blood pressure monitoring and Frequent blood sampling  Procedure Details Consent: Risks of procedure as well as the alternatives and risks of each were explained to the (patient/caregiver).  Consent for procedure obtained. Time Out: Verified patient identification, verified procedure, site/side was marked, verified correct patient position, special equipment/implants available, medications/allergies/relevent history reviewed, required imaging and test results available.  Performed  Maximum sterile technique was used including antiseptics, caps, gloves, shield, mask, and gown. Skin prep: Chlorhexidine; local anesthetic administered 22 gauge catheter was inserted into left radial artery using the Seldinger technique.  Evaluation Blood flow good; BP tracing good. Complications: No apparent complications.   Mike [name] [facility] 06/17/2017
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
## 2017-06-17 NOTE — Anesthesia Postprocedure Evaluation (Signed)
Anesthesia Post Note  Patient: Mike [name]  Procedure(s) Performed: Procedure(s) (LRB): EXTERNAL FIXATION Right  ANKLE with  irrigation and debridement, closed reduction (Right) OPEN REDUCTION INTERNAL FIXATION (ORIF) ELBOW/OLECRANON FRACTURE (Right) IRRIGATION AND DEBRIDEMENT WITH PERCUTANEOUS PINNING Left FOOT, and closed reduction with vac placement (Left) EXAM UNDER ANESTHESIA PELVIS (Bilateral)     Patient location during evaluation: SICU Anesthesia Type: General Level of consciousness: sedated Pain management: pain level controlled Vital Signs Assessment: post-procedure vital signs reviewed and stable Respiratory status: patient remains intubated per anesthesia plan Cardiovascular status: stable Anesthetic complications: no    Last Vitals:  Vitals:   06/17/17 1000 06/17/17 1627  BP:  (!) 174/72  Pulse: (!) 124 (!) 105  Resp: 15 17  Temp: 37.7 C   SpO2: 100% 97%    Last Pain:  Vitals:   06/16/17 2059  TempSrc: Tympanic                 Mike [name]

## 2017-06-17 NOTE — Progress Notes (Signed)
Patient transported from 4N26 to CT, then to Xray, and then back to 4N26 with no complications.

## 2017-06-18 ENCOUNTER — Inpatient Hospital Stay (HOSPITAL_COMMUNITY): Payer: Medicaid Other

## 2017-06-18 LAB — CBC
HCT: 26.3 % — ABNORMAL LOW (ref 39.0–52.0)
HEMATOCRIT: 25.7 % — AB (ref 39.0–52.0)
HEMATOCRIT: 27.8 % — AB (ref 39.0–52.0)
HEMATOCRIT: 28.3 % — AB (ref 39.0–52.0)
HEMOGLOBIN: 9.8 g/dL — AB (ref 13.0–17.0)
HEMOGLOBIN: 8.7 g/dL — AB (ref 13.0–17.0)
Hemoglobin: 9.1 g/dL — ABNORMAL LOW (ref 13.0–17.0)
Hemoglobin: 9.9 g/dL — ABNORMAL LOW (ref 13.0–17.0)
MCH: 29.3 pg (ref 26.0–34.0)
MCH: 29.6 pg (ref 26.0–34.0)
MCH: 29.8 pg (ref 26.0–34.0)
MCH: 30.1 pg (ref 26.0–34.0)
MCHC: 33.9 g/dL (ref 30.0–36.0)
MCHC: 34.6 g/dL (ref 30.0–36.0)
MCHC: 35 g/dL (ref 30.0–36.0)
MCHC: 35.3 g/dL (ref 30.0–36.0)
MCV: 85.3 fL (ref 78.0–100.0)
MCV: 85.2 fL (ref 78.0–100.0)
MCV: 86.5 fL (ref 78.0–100.0)
MCV: 85.7 fL (ref 78.0–100.0)
PLATELETS: 103 10*3/uL — AB (ref 150–400)
PLATELETS: 98 10*3/uL — AB (ref 150–400)
Platelets: 106 10*3/uL — ABNORMAL LOW (ref 150–400)
Platelets: 96 10*3/uL — ABNORMAL LOW (ref 150–400)
RBC: 2.97 MIL/uL — ABNORMAL LOW (ref 4.22–5.81)
RBC: 3.07 MIL/uL — AB (ref 4.22–5.81)
RBC: 3.26 MIL/uL — AB (ref 4.22–5.81)
RBC: 3.32 MIL/uL — ABNORMAL LOW (ref 4.22–5.81)
RDW: 15.2 % (ref 11.5–15.5)
RDW: 15.2 % (ref 11.5–15.5)
RDW: 15 % (ref 11.5–15.5)
RDW: 14.9 % (ref 11.5–15.5)
WBC: 10.9 10*3/uL — AB (ref 4.0–10.5)
WBC: 11 10*3/uL — AB (ref 4.0–10.5)
WBC: 12.4 10*3/uL — ABNORMAL HIGH (ref 4.0–10.5)
WBC: 12.9 10*3/uL — AB (ref 4.0–10.5)

## 2017-06-18 LAB — GLUCOSE, CAPILLARY
Glucose-Capillary: 124 mg/dL — ABNORMAL HIGH (ref 65–99)
Glucose-Capillary: 143 mg/dL — ABNORMAL HIGH (ref 65–99)
Glucose-Capillary: 145 mg/dL — ABNORMAL HIGH (ref 65–99)
Glucose-Capillary: 157 mg/dL — ABNORMAL HIGH (ref 65–99)

## 2017-06-18 LAB — BPAM FFP
Blood Product Expiration Date: 201809012359
Blood Product Expiration Date: 201809032359
ISSUE DATE / TIME: 201808291216
ISSUE DATE / TIME: 201808291216
UNIT TYPE AND RH: 6200
Unit Type and Rh: 600

## 2017-06-18 LAB — BASIC METABOLIC PANEL
Anion gap: 4 — ABNORMAL LOW (ref 5–15)
BUN: 9 mg/dL (ref 6–20)
CALCIUM: 7.7 mg/dL — AB (ref 8.9–10.3)
CHLORIDE: 108 mmol/L (ref 101–111)
CO2: 28 mmol/L (ref 22–32)
CREATININE: 1.04 mg/dL (ref 0.61–1.24)
Glucose, Bld: 123 mg/dL — ABNORMAL HIGH (ref 65–99)
Potassium: 3.7 mmol/L (ref 3.5–5.1)
SODIUM: 140 mmol/L (ref 135–145)

## 2017-06-18 LAB — PHOSPHORUS
PHOSPHORUS: 1.9 mg/dL — AB (ref 2.5–4.6)
Phosphorus: 2.4 mg/dL — ABNORMAL LOW (ref 2.5–4.6)

## 2017-06-18 LAB — PREPARE FRESH FROZEN PLASMA
UNIT DIVISION: 0
Unit division: 0

## 2017-06-18 LAB — MAGNESIUM
Magnesium: 1.7 mg/dL (ref 1.7–2.4)
Magnesium: 1.9 mg/dL (ref 1.7–2.4)

## 2017-06-18 LAB — PREPARE RBC (CROSSMATCH)

## 2017-06-18 MED ORDER — ORAL CARE MOUTH RINSE
15.0000 mL | OROMUCOSAL | Status: DC
  Administered 2017-06-18 – 2017-06-20 (×25): 15 mL via OROMUCOSAL

## 2017-06-18 MED ORDER — SODIUM CHLORIDE 0.9 % IV SOLN
1.0000 g | Freq: Once | INTRAVENOUS | Status: AC
  Administered 2017-06-18: 1 g via INTRAVENOUS
  Filled 2017-06-18 (×2): qty 10

## 2017-06-18 MED ORDER — SELENIUM 50 MCG PO TABS
200.0000 ug | ORAL_TABLET | Freq: Every day | ORAL | Status: AC
  Administered 2017-06-18 – 2017-06-23 (×5): 200 ug
  Filled 2017-06-18 (×7): qty 4

## 2017-06-18 MED ORDER — VITAMIN C 500 MG PO TABS
1000.0000 mg | ORAL_TABLET | Freq: Three times a day (TID) | ORAL | Status: AC
  Administered 2017-06-18 – 2017-06-25 (×18): 1000 mg
  Filled 2017-06-18 (×18): qty 2

## 2017-06-18 MED ORDER — VITAL HIGH PROTEIN PO LIQD: 1000.0000 mL | ORAL | Status: DC

## 2017-06-18 MED ORDER — PRO-STAT SUGAR FREE PO LIQD
30.0000 mL | Freq: Two times a day (BID) | ORAL | Status: DC
  Administered 2017-06-18: 30 mL
  Filled 2017-06-18: qty 30

## 2017-06-18 MED ORDER — SODIUM CHLORIDE 0.9 % IV SOLN: Freq: Once | INTRAVENOUS | Status: AC

## 2017-06-18 MED ORDER — PIVOT 1.5 CAL PO LIQD
1000.0000 mL | ORAL | Status: DC
  Administered 2017-06-18 – 2017-06-20 (×2): 1000 mL
  Filled 2017-06-18 (×6): qty 1000

## 2017-06-18 MED ORDER — MUPIROCIN 2 % EX OINT
TOPICAL_OINTMENT | Freq: Two times a day (BID) | CUTANEOUS | Status: DC
  Administered 2017-06-18 – 2017-06-19 (×2): 1 via NASAL
  Administered 2017-06-19 – 2017-06-20 (×2): via NASAL
  Administered 2017-06-20: 1 via NASAL
  Administered 2017-06-21: 22:00:00 via NASAL
  Administered 2017-06-21: 1 via NASAL
  Filled 2017-06-18: qty 22
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
## 2017-06-18 NOTE — Progress Notes (Signed)
Patient doing well. Hemoglobin stable overnight and pressures have been stable. Following commands this AM.  RUE: Splint in place and clean, median, ulnar, and radial nerves intact, warm and well perfused hand.  RLE: ex-fix in place, pin sites clean, significant swelling. Incisional wound vac in place without any output. Able to flex/extend big toe. States he has diminished sensation to dorsal and plantar foot. Traction in place  LLE: Splint in place, clean and dry. Endores feeling to toes. Wiggles big toe. Dressing minimally saturated.  Plan: 26 yo male with following orthopaedic injuries: 1. Type IIIA open left 2nd-5th metatarsal neck fractures 2. 1st left metatarsal shaft fracture 3. Left 1st TMT dislocation with middle and lateral cuneiform fractures 4. Dorsal talar head avulsion fracture and nondisplaced anterior process calcaneus fracture 5. Left SER2 closed ankle fracture, with small medial mal and posterior mal fracture 6. Type II open right ankle fracture 7. Traumatic posterior tibialis tendon rupture 8. Type II right open trans-olecranon fracture dislocation 9. Unstable pelvic ring injury with right zone 2 sacral fracture and left crescent iliac wing fracture  Will plan to proceed with pelvic fixation tomorrow. Risks and benefits discussed at length with patients father and mother. Risk of bleeding, infection, nerve injury, bowel and bladder dysfunction, and even death. Family is agreeable to surgery and will sign consent.  Will need to allow swelling to go down on RLE and LLE. Will need formal ORIF of right ankle and plan for repair/transfer of posterior tibialis at same surgery. Will also need formal ORIF of 1st metatarsal and Lis Franc injury with percutaneous fixation of metatarsal neck fractures

## 2017-06-18 NOTE — Progress Notes (Signed)
This note also relates to the following rows which could not be included: SpO2 - Cannot attach notes to unvalidated device data  Pt placed back on full vent support due to low RR.  RN notified.

## 2017-06-19 ENCOUNTER — Inpatient Hospital Stay (HOSPITAL_COMMUNITY): Payer: Medicaid Other | Admitting: Certified Registered Nurse Anesthetist

## 2017-06-19 ENCOUNTER — Inpatient Hospital Stay (HOSPITAL_COMMUNITY): Payer: Medicaid Other

## 2017-06-19 ENCOUNTER — Encounter (HOSPITAL_COMMUNITY): Admission: EM | Disposition: A | Discharge status: Home Health | Payer: Self-pay | Source: Home / Self Care

## 2017-06-19 HISTORY — PX: ORIF PELVIC FRACTURE: SHX2128

## 2017-06-19 LAB — BLOOD GAS, ARTERIAL
ACID-BASE EXCESS: 6.1 mmol/L — AB (ref 0.0–2.0)
BICARBONATE: 29.8 mmol/L — AB (ref 20.0–28.0)
Drawn by: 44135
FIO2: 40
O2 SAT: 99.2 %
PATIENT TEMPERATURE: 99.5
PCO2 ART: 42.5 mmHg (ref 32.0–48.0)
PEEP/CPAP: 5 cmH2O
RATE: 14 resp/min
VT: 600 mL
pH, Arterial: 7.463 — ABNORMAL HIGH (ref 7.350–7.450)
pO2, Arterial: 150 mmHg — ABNORMAL HIGH (ref 83.0–108.0)

## 2017-06-19 LAB — CBC
HEMATOCRIT: 25.8 % — AB (ref 39.0–52.0)
HEMATOCRIT: 30.6 % — AB (ref 39.0–52.0)
HEMOGLOBIN: 8.8 g/dL — AB (ref 13.0–17.0)
HEMOGLOBIN: 10.5 g/dL — AB (ref 13.0–17.0)
MCH: 29.6 pg (ref 26.0–34.0)
MCH: 29.8 pg (ref 26.0–34.0)
MCHC: 34.1 g/dL (ref 30.0–36.0)
MCHC: 34.3 g/dL (ref 30.0–36.0)
MCV: 86.9 fL (ref 78.0–100.0)
MCV: 86.9 fL (ref 78.0–100.0)
PLATELETS: 85 10*3/uL — AB (ref 150–400)
Platelets: 104 10*3/uL — ABNORMAL LOW (ref 150–400)
RBC: 2.97 MIL/uL — AB (ref 4.22–5.81)
RBC: 3.52 MIL/uL — ABNORMAL LOW (ref 4.22–5.81)
RDW: 14.9 % (ref 11.5–15.5)
RDW: 14.6 % (ref 11.5–15.5)
WBC: 11.9 10*3/uL — AB (ref 4.0–10.5)
WBC: 12.5 10*3/uL — AB (ref 4.0–10.5)

## 2017-06-19 LAB — GLUCOSE, CAPILLARY
GLUCOSE-CAPILLARY: 122 mg/dL — AB (ref 65–99)
Glucose-Capillary: 118 mg/dL — ABNORMAL HIGH (ref 65–99)
Glucose-Capillary: 113 mg/dL — ABNORMAL HIGH (ref 65–99)
Glucose-Capillary: 113 mg/dL — ABNORMAL HIGH (ref 65–99)

## 2017-06-19 LAB — BASIC METABOLIC PANEL
Anion gap: 5 (ref 5–15)
BUN: 10 mg/dL (ref 6–20)
CHLORIDE: 105 mmol/L (ref 101–111)
CO2: 27 mmol/L (ref 22–32)
CREATININE: 0.77 mg/dL (ref 0.61–1.24)
Calcium: 7.9 mg/dL — ABNORMAL LOW (ref 8.9–10.3)
GFR calc non Af Amer: >60 mL/min (ref >60)
Glucose, Bld: 123 mg/dL — ABNORMAL HIGH (ref 65–99)
POTASSIUM: 3.6 mmol/L (ref 3.5–5.1)
SODIUM: 137 mmol/L (ref 135–145)

## 2017-06-19 LAB — PHOSPHORUS
PHOSPHORUS: 2.3 mg/dL — AB (ref 2.5–4.6)
Phosphorus: 1.6 mg/dL — ABNORMAL LOW (ref 2.5–4.6)

## 2017-06-19 LAB — PREPARE RBC (CROSSMATCH)

## 2017-06-19 LAB — MAGNESIUM
Magnesium: 1.9 mg/dL (ref 1.7–2.4)
Magnesium: 1.7 mg/dL (ref 1.7–2.4)

## 2017-06-19 SURGERY — OPEN REDUCTION INTERNAL FIXATION (ORIF) PELVIC FRACTURE
Anesthesia: General

## 2017-06-19 MED ORDER — EPHEDRINE 5 MG/ML INJ
INTRAVENOUS | Status: AC
  Filled 2017-06-19: qty 10

## 2017-06-19 MED ORDER — FENTANYL CITRATE (PF) 250 MCG/5ML IJ SOLN
INTRAMUSCULAR | Status: AC
  Filled 2017-06-19: qty 5

## 2017-06-19 MED ORDER — TRANEXAMIC ACID 1000 MG/10ML IV SOLN
2000.0000 mg | INTRAVENOUS | Status: AC
  Administered 2017-06-19: 2000 mg via TOPICAL
  Filled 2017-06-19: qty 20

## 2017-06-19 MED ORDER — THROMBIN 20000 UNITS EX SOLR
CUTANEOUS | Status: AC
  Filled 2017-06-19: qty 20000

## 2017-06-19 MED ORDER — ROCURONIUM BROMIDE 10 MG/ML (PF) SYRINGE
PREFILLED_SYRINGE | INTRAVENOUS | Status: AC
  Filled 2017-06-19: qty 20

## 2017-06-19 MED ORDER — LIDOCAINE 2% (20 MG/ML) 5 ML SYRINGE
INTRAMUSCULAR | Status: AC
  Filled 2017-06-19: qty 10

## 2017-06-19 MED ORDER — ROCURONIUM BROMIDE 100 MG/10ML IV SOLN
INTRAVENOUS | Status: DC | PRN
  Administered 2017-06-19 (×3): 50 mg via INTRAVENOUS
  Administered 2017-06-19: 20 mg via INTRAVENOUS
  Administered 2017-06-19: 50 mg via INTRAVENOUS
  Administered 2017-06-19: 20 mg via INTRAVENOUS
  Administered 2017-06-19: 60 mg via INTRAVENOUS

## 2017-06-19 MED ORDER — PROPOFOL 10 MG/ML IV BOLUS
INTRAVENOUS | Status: DC | PRN
  Administered 2017-06-19: 40 mg via INTRAVENOUS

## 2017-06-19 MED ORDER — CEFAZOLIN SODIUM 1 G IJ SOLR
INTRAMUSCULAR | Status: AC
  Filled 2017-06-19: qty 20

## 2017-06-19 MED ORDER — CEFAZOLIN SODIUM-DEXTROSE 1-4 GM/50ML-% IV SOLN
INTRAVENOUS | Status: DC | PRN
  Administered 2017-06-19 (×2): 1 g via INTRAVENOUS

## 2017-06-19 MED ORDER — 0.9 % SODIUM CHLORIDE (POUR BTL) OPTIME
TOPICAL | Status: DC | PRN
  Administered 2017-06-19: 1000 mL

## 2017-06-19 MED ORDER — FENTANYL CITRATE (PF) 100 MCG/2ML IJ SOLN
INTRAMUSCULAR | Status: DC | PRN
  Administered 2017-06-19: 50 ug via INTRAVENOUS
  Administered 2017-06-19: 100 ug via INTRAVENOUS
  Administered 2017-06-19 (×5): 50 ug via INTRAVENOUS
  Administered 2017-06-19: 100 ug via INTRAVENOUS
  Administered 2017-06-19: 50 ug via INTRAVENOUS
  Administered 2017-06-19: 100 ug via INTRAVENOUS
  Administered 2017-06-19 (×5): 50 ug via INTRAVENOUS
  Administered 2017-06-19: 100 ug via INTRAVENOUS

## 2017-06-19 MED ORDER — VANCOMYCIN HCL 1000 MG IV SOLR
INTRAVENOUS | Status: AC
  Filled 2017-06-19: qty 1000

## 2017-06-19 MED ORDER — MIDAZOLAM HCL 2 MG/2ML IJ SOLN
INTRAMUSCULAR | Status: AC
  Filled 2017-06-19: qty 2

## 2017-06-19 MED ORDER — LABETALOL HCL 5 MG/ML IV SOLN
INTRAVENOUS | Status: DC | PRN
  Administered 2017-06-19: 5 mg via INTRAVENOUS

## 2017-06-19 MED ORDER — PROPOFOL 10 MG/ML IV BOLUS
INTRAVENOUS | Status: AC
  Filled 2017-06-19: qty 20

## 2017-06-19 MED ORDER — SUGAMMADEX SODIUM 200 MG/2ML IV SOLN
INTRAVENOUS | Status: AC
  Filled 2017-06-19: qty 2

## 2017-06-19 MED ORDER — ONDANSETRON HCL 4 MG/2ML IJ SOLN
INTRAMUSCULAR | Status: AC
  Filled 2017-06-19: qty 2

## 2017-06-19 MED ORDER — VANCOMYCIN HCL 1000 MG IV SOLR
INTRAVENOUS | Status: DC | PRN
  Administered 2017-06-19: 1000 mg

## 2017-06-19 MED ORDER — LACTATED RINGERS IV SOLN
INTRAVENOUS | Status: DC | PRN
  Administered 2017-06-19 (×3): via INTRAVENOUS

## 2017-06-19 MED ORDER — SODIUM CHLORIDE 0.9 % IV SOLN
INTRAVENOUS | Status: DC | PRN
  Administered 2017-06-19: 13:00:00 via INTRAVENOUS

## 2017-06-19 MED ORDER — MIDAZOLAM HCL 5 MG/5ML IJ SOLN
INTRAMUSCULAR | Status: DC | PRN
  Administered 2017-06-19: 2 mg via INTRAVENOUS

## 2017-06-19 MED ORDER — ALBUMIN HUMAN 5 % IV SOLN
INTRAVENOUS | Status: DC | PRN
  Administered 2017-06-19: 15:00:00 via INTRAVENOUS

## 2017-06-19 SURGICAL SUPPLY — 94 items
BANDAGE ACE 4X5 VEL STRL LF (GAUZE/BANDAGES/DRESSINGS) ×4 IMPLANT
BIT DRILL CANN 4.5MM (BIT) ×1 IMPLANT
BIT DRILL FLUTED 2.5 (BIT) ×1 IMPLANT
BLADE CLIPPER SURG (BLADE) ×2 IMPLANT
BLADE SURG 11 STRL SS (BLADE) ×2 IMPLANT
BNDG GAUZE ELAST 4 BULKY (GAUZE/BANDAGES/DRESSINGS) ×4 IMPLANT
BRUSH SCRUB SURG 4.25 DISP (MISCELLANEOUS) ×4 IMPLANT
CHLORAPREP W/TINT 26ML (MISCELLANEOUS) ×2 IMPLANT
COVER SURGICAL LIGHT HANDLE (MISCELLANEOUS) ×2 IMPLANT
DERMABOND ADHESIVE PROPEN (GAUZE/BANDAGES/DRESSINGS) ×2
DERMABOND ADVANCED .7 DNX6 (GAUZE/BANDAGES/DRESSINGS) ×2 IMPLANT
DRAIN CHANNEL 15F RND FF W/TCR (WOUND CARE) IMPLANT
DRAIN HEMOVAC 7FR (DRAIN) ×2 IMPLANT
DRAPE C-ARM 35X43 STRL (DRAPES) ×2 IMPLANT
DRAPE C-ARM 42X72 X-RAY (DRAPES) ×2 IMPLANT
DRAPE C-ARMOR (DRAPES) ×2 IMPLANT
DRAPE HALF SHEET 40X57 (DRAPES) ×4 IMPLANT
DRAPE INCISE IOBAN 66X45 STRL (DRAPES) ×4 IMPLANT
DRAPE LAPAROTOMY TRNSV 102X78 (DRAPE) ×2 IMPLANT
DRAPE SURG 17X23 STRL (DRAPES) ×2 IMPLANT
DRAPE U-SHAPE 47X51 STRL (DRAPES) ×2 IMPLANT
DRILL BIT CANN 4.5MM (BIT) ×1
DRILL BIT FLUTED 2.5 (BIT) ×2
DRSG ADAPTIC 3X8 NADH LF (GAUZE/BANDAGES/DRESSINGS) ×2 IMPLANT
DRSG MEPILEX BORDER 4X4 (GAUZE/BANDAGES/DRESSINGS) ×6 IMPLANT
DRSG MEPILEX BORDER 4X8 (GAUZE/BANDAGES/DRESSINGS) ×4 IMPLANT
ELECT REM PT RETURN 9FT ADLT (ELECTROSURGICAL) ×2
ELECTRODE REM PT RTRN 9FT ADLT (ELECTROSURGICAL) ×1 IMPLANT
EVACUATOR SILICONE 100CC (DRAIN) IMPLANT
GAUZE SPONGE 4X4 12PLY STRL (GAUZE/BANDAGES/DRESSINGS) ×4 IMPLANT
GLOVE BIO SURGEON STRL SZ7.5 (GLOVE) ×18 IMPLANT
GLOVE BIOGEL M 8.0 STRL (GLOVE) ×8 IMPLANT
GLOVE BIOGEL PI IND STRL 6.5 (GLOVE) ×1 IMPLANT
GLOVE BIOGEL PI IND STRL 7.0 (GLOVE) ×1 IMPLANT
GLOVE BIOGEL PI IND STRL 7.5 (GLOVE) ×1 IMPLANT
GLOVE BIOGEL PI INDICATOR 6.5 (GLOVE) ×1
GLOVE BIOGEL PI INDICATOR 7.0 (GLOVE) ×1
GLOVE BIOGEL PI INDICATOR 7.5 (GLOVE) ×1
GLOVE SURG SS PI 7.0 STRL IVOR (GLOVE) ×2 IMPLANT
GOWN STRL REUS W/ TWL LRG LVL3 (GOWN DISPOSABLE) ×4 IMPLANT
GOWN STRL REUS W/TWL LRG LVL3 (GOWN DISPOSABLE) ×4
GUIDEWIRE 2.0MM (WIRE) ×4 IMPLANT
KIT BASIN OR (CUSTOM PROCEDURE TRAY) ×2 IMPLANT
KIT DRSG PREVENA PLUS 7DAY 125 (MISCELLANEOUS) ×2 IMPLANT
KIT ROOM TURNOVER OR (KITS) ×2 IMPLANT
MANIFOLD NEPTUNE II (INSTRUMENTS) ×2 IMPLANT
NS IRRIG 1000ML POUR BTL (IV SOLUTION) ×4 IMPLANT
PACK TOTAL JOINT (CUSTOM PROCEDURE TRAY) ×2 IMPLANT
PACK UNIVERSAL I (CUSTOM PROCEDURE TRAY) ×2 IMPLANT
PAD ARMBOARD 7.5X6 YLW CONV (MISCELLANEOUS) ×4 IMPLANT
PIN APEX 6.0X250MM EXFIX (EXFIX) ×2 IMPLANT
PIN SCHANZ 4MM 130MM (PIN) ×2 IMPLANT
PLATE BONE 78MM 6HOLE PELVIC (Plate) ×2 IMPLANT
PLATE BONE 91MM 7HOLE PELVIC (Plate) ×2 IMPLANT
PLATE PUBLIC SYMPHOSIS 3.5 (Plate) ×2 IMPLANT
SCHANZ 6.0 X 250 ×2 IMPLANT
SCREW BONE CANN 7.3X145MM F/TH (Screw) ×2 IMPLANT
SCREW BONE CANN 7.3X95MM F/TH (Screw) ×2 IMPLANT
SCREW CORTEX 3.5 16MM (Screw) ×2 IMPLANT
SCREW CORTEX 3.5 18MM (Screw) ×3 IMPLANT
SCREW CORTEX 3.5 22MM (Screw) ×2 IMPLANT
SCREW CORTEX 3.5 28MM (Screw) ×1 IMPLANT
SCREW CORTEX 3.5 30MM (Screw) ×1 IMPLANT
SCREW CORTEX 3.5 40MM (Screw) ×1 IMPLANT
SCREW CORTEX 3.5 45MM (Screw) ×4 IMPLANT
SCREW CORTEX 3.5 55MM (Screw) ×6 IMPLANT
SCREW CORTEX 3.5 60MM (Screw) ×2 IMPLANT
SCREW LOCK CORT ST 3.5X16 (Screw) ×2 IMPLANT
SCREW LOCK CORT ST 3.5X18 (Screw) ×3 IMPLANT
SCREW LOCK CORT ST 3.5X22 (Screw) ×2 IMPLANT
SCREW LOCK CORT ST 3.5X28 (Screw) ×1 IMPLANT
SCREW LOCK CORT ST 3.5X30 (Screw) ×1 IMPLANT
SCREW LOCK CORT ST 3.5X40 (Screw) ×1 IMPLANT
SLEEVE ARM SUSPENSION SYSTEM (MISCELLANEOUS) ×2 IMPLANT
SPADE POINT WIRE 2.0 (Orthopedic Implant) ×2 IMPLANT
SPONGE LAP 18X18 X RAY DECT (DISPOSABLE) IMPLANT
STAPLER VISISTAT 35W (STAPLE) ×2 IMPLANT
SUCTION FRAZIER HANDLE 10FR (MISCELLANEOUS)
SUCTION TUBE FRAZIER 10FR DISP (MISCELLANEOUS) IMPLANT
SUT ETHILON 3 0 PS 1 (SUTURE) ×2 IMPLANT
SUT MNCRL AB 3-0 PS2 18 (SUTURE) ×4 IMPLANT
SUT MON AB 2-0 CT1 36 (SUTURE) IMPLANT
SUT VIC AB 0 CT1 27 (SUTURE) ×2
SUT VIC AB 0 CT1 27XBRD ANBCTR (SUTURE) ×2 IMPLANT
SUT VIC AB 1 CT1 18XCR BRD 8 (SUTURE) IMPLANT
SUT VIC AB 1 CT1 8-18 (SUTURE)
SUT VIC AB 2-0 CT1 27 (SUTURE) ×3
SUT VIC AB 2-0 CT1 TAPERPNT 27 (SUTURE) ×3 IMPLANT
TOWEL OR 17X24 6PK STRL BLUE (TOWEL DISPOSABLE) ×2 IMPLANT
TOWEL OR 17X26 10 PK STRL BLUE (TOWEL DISPOSABLE) ×2 IMPLANT
TRAY FOLEY W/METER SILVER 16FR (SET/KITS/TRAYS/PACK) IMPLANT
WATER STERILE IRR 1000ML POUR (IV SOLUTION) ×8 IMPLANT
WIRE SPADE POINT 2.0 (Orthopedic Implant) ×1 IMPLANT
YANKAUER SUCT BULB TIP NO VENT (SUCTIONS) ×2 IMPLANT
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
## 2017-06-19 NOTE — Progress Notes (Signed)
Patient ID: Mike [name], male   DOB: 01/20/1991, 26 y.o.   MRN: [id number] Patient off the unit in the OR for orthopedic pelvic repair  Continue c-collar no new neurosurgical conditions

## 2017-06-19 NOTE — Transfer of Care (Signed)
Immediate Anesthesia Transfer of Care Note  Patient: Mike [name]  Procedure(s) Performed: Procedure(s): OPEN REDUCTION INTERNAL FIXATION (ORIF) PELVIC FRACTURE dressing change right arm and both legs  (N/A)  Patient Location: ICU  Anesthesia Type:General  Level of Consciousness: Patient remains intubated per anesthesia plan  Airway & Oxygen Therapy: Patient remains intubated per anesthesia plan and Patient placed on Ventilator (see vital sign flow sheet for setting)  Post-op Assessment: Report given to RN and Post -op Vital signs reviewed and stable  Post vital signs: Reviewed and stable  Last Vitals:  Vitals:   06/19/17 1140 06/19/17 1146  BP: 128/83   Pulse: (!) 118 (!) 124  Resp: 14 18  Temp: 37.7 C   SpO2: 100% 100%    Last Pain:  Vitals:   06/18/17 1800  TempSrc: Core (Comment)         Complications: No apparent anesthesia complications
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
## 2017-06-20 ENCOUNTER — Inpatient Hospital Stay (HOSPITAL_COMMUNITY): Payer: Medicaid Other

## 2017-06-20 LAB — GLUCOSE, CAPILLARY
GLUCOSE-CAPILLARY: 136 mg/dL — AB (ref 65–99)
GLUCOSE-CAPILLARY: 132 mg/dL — AB (ref 65–99)
GLUCOSE-CAPILLARY: 120 mg/dL — AB (ref 65–99)
Glucose-Capillary: 121 mg/dL — ABNORMAL HIGH (ref 65–99)
Glucose-Capillary: 127 mg/dL — ABNORMAL HIGH (ref 65–99)
Glucose-Capillary: 152 mg/dL — ABNORMAL HIGH (ref 65–99)

## 2017-06-20 LAB — BASIC METABOLIC PANEL
ANION GAP: 6 (ref 5–15)
BUN: 11 mg/dL (ref 6–20)
CALCIUM: 7.3 mg/dL — AB (ref 8.9–10.3)
CHLORIDE: 104 mmol/L (ref 101–111)
CO2: 27 mmol/L (ref 22–32)
Creatinine, Ser: 0.71 mg/dL (ref 0.61–1.24)
GFR calc Af Amer: >60 mL/min (ref >60)
GFR calc non Af Amer: >60 mL/min (ref >60)
GLUCOSE: 146 mg/dL — AB (ref 65–99)
Potassium: 3.6 mmol/L (ref 3.5–5.1)
Sodium: 137 mmol/L (ref 135–145)

## 2017-06-20 LAB — POCT I-STAT 7, (LYTES, BLD GAS, ICA,H+H)
ACID-BASE DEFICIT: 3 mmol/L — AB (ref 0.0–2.0)
ACID-BASE EXCESS: 4 mmol/L — AB (ref 0.0–2.0)
Acid-base deficit: 3 mmol/L — ABNORMAL HIGH (ref 0.0–2.0)
Acid-base deficit: 2 mmol/L (ref 0.0–2.0)
BICARBONATE: 23.8 mmol/L (ref 20.0–28.0)
Bicarbonate: 22.5 mmol/L (ref 20.0–28.0)
Bicarbonate: 22.4 mmol/L (ref 20.0–28.0)
Bicarbonate: 28.2 mmol/L — ABNORMAL HIGH (ref 20.0–28.0)
CALCIUM ION: 1.16 mmol/L (ref 1.15–1.40)
CALCIUM ION: 1.17 mmol/L (ref 1.15–1.40)
Calcium, Ion: 1.07 mmol/L — ABNORMAL LOW (ref 1.15–1.40)
Calcium, Ion: 1.14 mmol/L — ABNORMAL LOW (ref 1.15–1.40)
HCT: 22 % — ABNORMAL LOW (ref 39.0–52.0)
HCT: 23 % — ABNORMAL LOW (ref 39.0–52.0)
HCT: 26 % — ABNORMAL LOW (ref 39.0–52.0)
HEMATOCRIT: 21 % — AB (ref 39.0–52.0)
HEMOGLOBIN: 7.8 g/dL — AB (ref 13.0–17.0)
HEMOGLOBIN: 8.8 g/dL — AB (ref 13.0–17.0)
Hemoglobin: 7.1 g/dL — ABNORMAL LOW (ref 13.0–17.0)
Hemoglobin: 7.5 g/dL — ABNORMAL LOW (ref 13.0–17.0)
O2 Saturation: 100 %
O2 Saturation: 100 %
O2 Saturation: 100 %
O2 Saturation: 100 %
PCO2 ART: 33.1 mmHg (ref 32.0–48.0)
PH ART: 7.274 — AB (ref 7.350–7.450)
PH ART: 7.435 (ref 7.350–7.450)
PO2 ART: 311 mmHg — AB (ref 83.0–108.0)
PO2 ART: 249 mmHg — AB (ref 83.0–108.0)
POTASSIUM: 4.1 mmol/L (ref 3.5–5.1)
POTASSIUM: 3.7 mmol/L (ref 3.5–5.1)
Patient temperature: 36.6
Patient temperature: 35.7
Patient temperature: 36.7
Potassium: 4.7 mmol/L (ref 3.5–5.1)
Potassium: 4.4 mmol/L (ref 3.5–5.1)
SODIUM: 138 mmol/L (ref 135–145)
Sodium: 141 mmol/L (ref 135–145)
Sodium: 141 mmol/L (ref 135–145)
Sodium: 141 mmol/L (ref 135–145)
TCO2: 25 mmol/L (ref 22–32)
TCO2: 24 mmol/L (ref 22–32)
TCO2: 23 mmol/L (ref 22–32)
TCO2: 29 mmol/L (ref 22–32)
pCO2 arterial: 51.1 mmHg — ABNORMAL HIGH (ref 32.0–48.0)
pCO2 arterial: 37.6 mmHg (ref 32.0–48.0)
pCO2 arterial: 41.9 mmHg (ref 32.0–48.0)
pH, Arterial: 7.379 (ref 7.350–7.450)
pH, Arterial: 7.435 (ref 7.350–7.450)
pO2, Arterial: 280 mmHg — ABNORMAL HIGH (ref 83.0–108.0)
pO2, Arterial: 194 mmHg — ABNORMAL HIGH (ref 83.0–108.0)

## 2017-06-20 LAB — POCT I-STAT 4, (NA,K, GLUC, HGB,HCT)
Glucose, Bld: 140 mg/dL — ABNORMAL HIGH (ref 65–99)
Glucose, Bld: 145 mg/dL — ABNORMAL HIGH (ref 65–99)
HCT: 24 % — ABNORMAL LOW (ref 39.0–52.0)
HCT: 25 % — ABNORMAL LOW (ref 39.0–52.0)
Hemoglobin: 8.2 g/dL — ABNORMAL LOW (ref 13.0–17.0)
Hemoglobin: 8.5 g/dL — ABNORMAL LOW (ref 13.0–17.0)
Potassium: 3.8 mmol/L (ref 3.5–5.1)
Potassium: 3.8 mmol/L (ref 3.5–5.1)
SODIUM: 138 mmol/L (ref 135–145)
Sodium: 139 mmol/L (ref 135–145)

## 2017-06-20 LAB — CBC
HEMATOCRIT: 29.9 % — AB (ref 39.0–52.0)
HEMOGLOBIN: 10.1 g/dL — AB (ref 13.0–17.0)
MCH: 29.1 pg (ref 26.0–34.0)
MCHC: 33.8 g/dL (ref 30.0–36.0)
MCV: 86.2 fL (ref 78.0–100.0)
Platelets: 101 10*3/uL — ABNORMAL LOW (ref 150–400)
RBC: 3.47 MIL/uL — ABNORMAL LOW (ref 4.22–5.81)
RDW: 14.7 % (ref 11.5–15.5)
WBC: 11.7 10*3/uL — AB (ref 4.0–10.5)

## 2017-06-20 MED ORDER — OXYCODONE HCL 5 MG PO TABS
5.0000 mg | ORAL_TABLET | ORAL | Status: DC | PRN
  Administered 2017-06-20 – 2017-06-25 (×28): 10 mg via ORAL
  Filled 2017-06-20 (×29): qty 2

## 2017-06-20 MED ORDER — METOPROLOL TARTRATE 12.5 MG HALF TABLET
12.5000 mg | ORAL_TABLET | Freq: Two times a day (BID) | ORAL | Status: DC
  Administered 2017-06-20 – 2017-07-09 (×39): 12.5 mg via ORAL
  Filled 2017-06-20 (×38): qty 1

## 2017-06-20 MED ORDER — FENTANYL CITRATE (PF) 100 MCG/2ML IJ SOLN
25.0000 ug | INTRAMUSCULAR | Status: DC | PRN
  Administered 2017-06-20 – 2017-06-21 (×20): 50 ug via INTRAVENOUS
  Filled 2017-06-20 (×20): qty 2

## 2017-06-20 NOTE — Anesthesia Postprocedure Evaluation (Signed)
Anesthesia Post Note  Patient: Leonia CoronaDerek B Geraghty  Procedure(s) Performed: Procedure(s) (LRB): OPEN REDUCTION INTERNAL FIXATION (ORIF) PELVIC FRACTURE dressing change right arm and both legs  (N/A)     Patient location during evaluation: SICU Anesthesia Type: General Level of consciousness: sedated Pain management: pain level controlled Vital Signs Assessment: post-procedure vital signs reviewed and stable Respiratory status: patient remains intubated per anesthesia plan Cardiovascular status: stable Anesthetic complications: no    Last Vitals:  Vitals:   06/20/17 0800 06/20/17 0900  BP: 135/87   Pulse: (!) 112 (!) 115  Resp: 17 17  Temp: 37.6 C 37.6 C  SpO2: 100% 100%    Last Pain:  Vitals:   06/20/17 0800  TempSrc: Core (Comment)                 Beryle Lathehomas E Brock

## 2017-06-20 NOTE — Progress Notes (Signed)
Patient ID: Mike [name], male   DOB: 04/15/1991, 26 y.o.   MRN: [id number] Subjective:  The patient is intubated and heavily sedated.  Objective: Vital signs in last 24 hours: Temp:  [97.9 F (36.6 C)-100.2 F (37.9 C)] 99.7 F (37.6 C) (09/01 0900) Pulse Rate:  [96-128] 115 (09/01 0900) Resp:  [14-25] 17 (09/01 0900) BP: (124-197)/(70-93) 135/87 (09/01 0800) SpO2:  [100 %] 100 % (09/01 0900) Arterial Line BP: (158-210)/(66-80) 173/75 (09/01 0900) FiO2 (%):  [30 %-40 %] 30 % (09/01 0800) Weight:  [79.9 kg (176 lb 2.4 oz)] 79.9 kg (176 lb 2.4 oz) (09/01 0135)  Intake/Output from previous day: 08/31 0701 - 09/01 0700 In: 7577 [I.V.:5277; Blood:1260; NG/GT:690; IV Piggyback:350] Out: 4327 [Urine:3015; Drains:130; Blood:1000; Chest Tube:126] Intake/Output this shift: Total I/O In: 250 [I.V.:250] Out: -   Physical exam by the nurses report the patient is agitated when not heavily sedated. He reportedly follows commands.  Lab Results:  Recent Labs  06/19/17 2246 06/20/17 0459  WBC 12.5* 11.7*  HGB 10.5* 10.1*  HCT 30.6* 29.9*  PLT 85* 101*   BMET  Recent Labs  06/19/17 0431 06/20/17 0459  NA 137 137  K 3.6 3.6  CL 105 104  CO2 27 27  GLUCOSE 123* 146*  BUN 10 11  CREATININE 0.77 0.71  CALCIUM 7.9* 7.3*    Studies/Results: Ct Pelvis Wo Contrast  Result Date: 06/20/2017 CLINICAL DATA:  Follow-up pelvic fracture status post surgery. EXAM: CT PELVIS WITHOUT CONTRAST TECHNIQUE: Multidetector CT imaging of the pelvis was performed following the standard protocol without intravenous contrast. COMPARISON:  Radiographs 06/19/2017, CT 06/16/2017 FINDINGS: Urinary Tract: Foley catheter is present in the bladder. There is a small amount of air within the anterior aspect of the bladder. Bowel: No significant wall thickening of the visualized distal bowel. Vascular/Lymphatic: Non aneurysmal distal aorta. No grossly enlarged lymph nodes allowing for soft tissue stranding and  edema. Reproductive:  No mass or other significant abnormality Other: Small amount of hemorrhagic fluid within the presacral space. Moderate edema and soft tissue stranding within the pelvic fat. Interval placement of a percutaneous drain with the tip visualized anterior to the urinary bladder. Musculoskeletal: Multiple gas bubbles within the subcutaneous soft tissues of the lower abdomen and pelvis felt related to recent surgery. Asymmetric thickening of the left lower rectus likely represents hematoma. Fluid and edema over the bilateral hips and fluid extending along the fact Scholl surfaces of the proximal left thigh. Multiple soft tissue gas along the left pelvic sidewall with small hematoma present. Re- demonstrated displaced transverse process fractures on the right side at L4 and L5. Interval screw fixation of the right SI joint with screw directed obliquely posterior to anterior. Second fixating screw across the bilateral SI joints. The screws bridge a complex comminuted fracture of the right sacrum which extends through the sacral for a min and to the posterior aspect of right SI joint. Superiorly the fracture extends to the articular surface of the right L5-S1 facet joint. Re- demonstrated transverse fracture through the left sacrum. Interval surgical plate and screw fixation of the comminuted left iliac bone fractures with minimal residual displacement anteriorly. Decreased widening of the left SI joint. Interval surgical plate and screw fixation of the pubic symphysis with decreased separation of the pubic symphysis. IMPRESSION: 1. Interval postsurgical changes of the pelvis including SI joint screws, left iliac bone surgical plate and multiple screw fixation, and surgical plate and screw fixation of the pubic symphysis for multiple fractures  involving the sacrum and left iliac bone. Decreased separation of the pubic symphysis. Multiple pockets of gas along the left pelvic sidewall and within the soft  tissues anterior to the pelvis and left hip consistent with recent surgical status. Placement of a drain with tip positioned anterior to the bladder. 2. Small amount of hemorrhagic fluid within the presacral space. Slight asymmetric thickening of the left inferior rectus with multiple gas bubbles, likely due to small postoperative hematoma. 3. Recent demonstrated displaced fractures of the right L4 and L5 transverse process. Electronically Signed   By: Jasmine Pang M.D.   On: 06/20/2017 01:03   Dg Pelvis Comp Min 3v  Result Date: 06/19/2017 CLINICAL DATA:  Follow-up pelvic fractures status post surgical fixation EXAM: JUDET PELVIS - 3+ VIEW COMPARISON:  Intraoperative fluoroscopic pelvic radiographs from earlier today. 06/08/2017 pelvic radiographs and CT. FINDINGS: Near-anatomic alignment of comminuted left iliac bone fracture status post transfixation by 2 surgical plates with multiple interlocking screws. Near-anatomic alignment of comminuted right greater than left sacral fractures status post transfixation by upper right sacral screw traversing the upper right sacroiliac joint and by long transverse mid sacral screw traversing the bilateral sacroiliac joints. No significant residual widening of the sacroiliac joints. No significant residual widening or offset at the pubic symphysis status post transfixation by superior surgical plate with multiple interlocking screws. No hip dislocation. No suspicious focal osseous lesions. Surgical drain terminates in the deep pelvis near the midline. A separate catheter terminates over the coccyx. IMPRESSION: 1. Near-anatomic alignment of comminuted left iliac bone fracture status post ORIF . 2. Near-anatomic alignment of comminuted right greater than left sacral fractures status post ORIF . 3. No residual widening at the sacroiliac joints or pubic symphysis status post surgical fixation. Electronically Signed   By: Delbert Phenix M.D.   On: 06/19/2017 23:47   Dg Pelvis  Comp Min 3v  Result Date: 06/19/2017 CLINICAL DATA:  Pelvic fractures and diastasis. EXAM: JUDET PELVIS - 3+ VIEW; DG C-ARM GT 120 MIN COMPARISON:  06/17/2017.  Pelvis CT dated 06/16/2017. FINDINGS: Eleven C-arm views of the pelvis demonstrate interval screw and plate fixation of the symphysis pubis and previously demonstrated left iliac bone fracture. There is also screw fixation of the right sacral fracture. Essentially anatomic position and alignment. IMPRESSION: Hardware fixation of the previously described pelvic fractures and diastasis. Electronically Signed   By: Beckie Salts M.D.   On: 06/19/2017 19:45   Dg Chest Port 1 View  Result Date: 06/20/2017 CLINICAL DATA:  Respiratory failure. EXAM: PORTABLE CHEST 1 VIEW COMPARISON:  One-view chest x-ray 06/19/2017. FINDINGS: Heart size is normal. Endotracheal tube, left subclavian line, and bilateral chest tubes are stable. There is no pneumothorax. No significant edema or effusion is present. The visualized soft tissues and bony thorax are unremarkable. IMPRESSION: 1. Support apparatus is stable. 2. Bilateral chest tubes in place without pneumothorax. 3. Improved aeration without significant focal airspace disease or edema. Electronically Signed   By: Marin Roberts M.D.   On: 06/20/2017 09:18   Dg Chest Port 1 View  Result Date: 06/19/2017 CLINICAL DATA:  Bilateral pneumothoraces. EXAM: PORTABLE CHEST 1 VIEW COMPARISON:  06/18/2017 . FINDINGS: Endotracheal tube, NG tube, left subclavian line, bilateral chest tubes in stable position. No pneumothorax identified. Mild bibasilar atelectasis. Small left pleural effusion. Heart size stable. IMPRESSION: 1. Lines and tubes including bilateral chest tubes in stable position. No pneumothorax. 2.  Mild bibasilar atelectasis.  Small left pleural effusion. Electronically Signed   By: Maisie Fus  Register   On: 06/19/2017 07:26   Dg C-arm Gt 120 Min  Result Date: 06/19/2017 CLINICAL DATA:  Pelvic fractures and  diastasis. EXAM: JUDET PELVIS - 3+ VIEW; DG C-ARM GT 120 MIN COMPARISON:  06/17/2017.  Pelvis CT dated 06/16/2017. FINDINGS: Eleven C-arm views of the pelvis demonstrate interval screw and plate fixation of the symphysis pubis and previously demonstrated left iliac bone fracture. There is also screw fixation of the right sacral fracture. Essentially anatomic position and alignment. IMPRESSION: Hardware fixation of the previously described pelvic fractures and diastasis. Electronically Signed   By: Beckie Salts M.D.   On: 06/19/2017 19:45    Assessment/Plan: C7 transverse process fracture: This should heal well and the collar.  LOS: 4 days     Donte Lenzo D 06/20/2017, 10:10 AM

## 2017-06-20 NOTE — Progress Notes (Signed)
POD1 from ORIF pelvis. Doing okay this morning. Per nursing highly agitated with sedation lowered. No issues otherwise.  VSS, Hgb 10.1 this AM  RUE: Kerlix wrap in place with minimal serous strikethrough. ROM of elbow without difficulty  RLE: ex-fix in place, pin sites clean, significant swelling. Incisional wound vac in place without any output. Unable to get neuro exam this AM  LLE: Splint in place, clean and dry. Unable to obtain neuro exam this AM. Dressing minimally saturated.  Pelvis: Drain in place, dressing clean, dry and intact. Some weeping from areas from draping during surgery  Plan: 26 yo male with following orthopaedic injuries: 1. Type IIIA open left 2nd-5th metatarsal neck fractures s/p I&D 2. 1st left metatarsal shaft fracture 3. Left 1st TMT dislocation with middle and lateral cuneiform fractures s/p CRPP of 1st TMT 4. Dorsal talar head avulsion fracture and nondisplaced anterior process calcaneus fracture 5. Left SER2 closed ankle fracture, with small medial mal and posterior mal fracture 6. Type II open right ankle fracture s/p external fixator 7. Traumatic posterior tibialis tendon rupture 8. Type II right open trans-olecranon fracture dislocation s/p ORIF 9. Unstable pelvic ring injury with right zone 2 sacral fracture and left crescent iliac wing fracture s/p ORIF/CRPP  Pelvis now stabilized. No motion restrictions from pelvis. Will plan to have patient NWB BLE/RUE for significant period of time.  Will need to allow swelling to go down on RLE and LLE. Will need formal ORIF of right ankle and plan for repair/transfer of posterior tibialis at same surgery. Will also need formal ORIF of 1st metatarsal and Lis Franc injury with percutaneous fixation of metatarsal neck fractures. Potentially plan for late next week for surgery.

## 2017-06-20 NOTE — Progress Notes (Signed)
Wasted 45mL of Versed with Justice BritainMegan Bergam in sink.  Cayton Cuevas R. Ventry-Smith, RN
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
## 2017-06-20 NOTE — Procedures (Signed)
Extubation Procedure Note  Patient Details:   Name: Leonia CoronaDerek B Frogge DOB: 02/26/1991 MRN: [id number]   Airway Documentation:     Evaluation  O2 sats: stable throughout Complications: No apparent complications Patient did tolerate procedure well. Bilateral Breath Sounds: Clear   Yes   Patient extubated to 2L nasal cannula per MD order.  Positive cuff leak noted.  No evidence of stridor.  Patient able to speak post extubation.  Sats currently 100%.  Vitals are stable.  No complications noted.    Durwin GlazeBrown, Dehaven Sine N 06/20/2017, 12:38 PM

## 2017-06-21 ENCOUNTER — Inpatient Hospital Stay (HOSPITAL_COMMUNITY): Payer: Medicaid Other

## 2017-06-21 LAB — BPAM RBC
BLOOD PRODUCT EXPIRATION DATE: 201809202359
BLOOD PRODUCT EXPIRATION DATE: 201809202359
Blood Product Expiration Date: 201809202359
Blood Product Expiration Date: 201809202359
Blood Product Expiration Date: 201809202359
Blood Product Expiration Date: 201809202359
Blood Product Expiration Date: 201809202359
Blood Product Expiration Date: 201809202359
ISSUE DATE / TIME: 201808291245
ISSUE DATE / TIME: 201808311205
ISSUE DATE / TIME: 201808291245
ISSUE DATE / TIME: 201808311205
ISSUE DATE / TIME: 201808311437
ISSUE DATE / TIME: 201808311437
UNIT TYPE AND RH: 6200
UNIT TYPE AND RH: 6200
Unit Type and Rh: 6200
Unit Type and Rh: 6200
Unit Type and Rh: 6200
Unit Type and Rh: 6200
Unit Type and Rh: 6200
Unit Type and Rh: 6200

## 2017-06-21 LAB — TYPE AND SCREEN
ABO/RH(D): A POS
ANTIBODY SCREEN: NEGATIVE
UNIT DIVISION: 0
UNIT DIVISION: 0
UNIT DIVISION: 0
Unit division: 0
Unit division: 0
Unit division: 0
Unit division: 0
Unit division: 0

## 2017-06-21 LAB — GLUCOSE, CAPILLARY
GLUCOSE-CAPILLARY: 127 mg/dL — AB (ref 65–99)
GLUCOSE-CAPILLARY: 128 mg/dL — AB (ref 65–99)
GLUCOSE-CAPILLARY: 88 mg/dL (ref 65–99)
Glucose-Capillary: 130 mg/dL — ABNORMAL HIGH (ref 65–99)
Glucose-Capillary: 115 mg/dL — ABNORMAL HIGH (ref 65–99)
Glucose-Capillary: 117 mg/dL — ABNORMAL HIGH (ref 65–99)

## 2017-06-21 LAB — CBC
HEMATOCRIT: 31 % — AB (ref 39.0–52.0)
Hemoglobin: 10.6 g/dL — ABNORMAL LOW (ref 13.0–17.0)
MCH: 29.8 pg (ref 26.0–34.0)
MCHC: 34.2 g/dL (ref 30.0–36.0)
MCV: 87.1 fL (ref 78.0–100.0)
PLATELETS: 130 10*3/uL — AB (ref 150–400)
RBC: 3.56 MIL/uL — ABNORMAL LOW (ref 4.22–5.81)
RDW: 14.5 % (ref 11.5–15.5)
WBC: 11.5 10*3/uL — AB (ref 4.0–10.5)

## 2017-06-21 LAB — BASIC METABOLIC PANEL
ANION GAP: 8 (ref 5–15)
BUN: 9 mg/dL (ref 6–20)
CO2: 28 mmol/L (ref 22–32)
Calcium: 7.8 mg/dL — ABNORMAL LOW (ref 8.9–10.3)
Chloride: 99 mmol/L — ABNORMAL LOW (ref 101–111)
Creatinine, Ser: 0.6 mg/dL — ABNORMAL LOW (ref 0.61–1.24)
Glucose, Bld: 110 mg/dL — ABNORMAL HIGH (ref 65–99)
POTASSIUM: 3.5 mmol/L (ref 3.5–5.1)
Sodium: 135 mmol/L (ref 135–145)

## 2017-06-21 MED ORDER — DIPHENHYDRAMINE HCL 12.5 MG/5ML PO ELIX
12.5000 mg | ORAL_SOLUTION | Freq: Four times a day (QID) | ORAL | Status: DC | PRN
  Filled 2017-06-21: qty 5

## 2017-06-21 MED ORDER — ENOXAPARIN SODIUM 40 MG/0.4ML ~~LOC~~ SOLN
40.0000 mg | Freq: Every day | SUBCUTANEOUS | Status: DC
  Administered 2017-06-21 – 2017-07-08 (×18): 40 mg via SUBCUTANEOUS
  Filled 2017-06-21 (×19): qty 0.4

## 2017-06-21 MED ORDER — NALOXONE HCL 0.4 MG/ML IJ SOLN: 0.4000 mg | INTRAMUSCULAR | Status: DC | PRN

## 2017-06-21 MED ORDER — FENTANYL 40 MCG/ML IV SOLN
INTRAVENOUS | Status: DC
  Administered 2017-06-21: 23:00:00 via INTRAVENOUS
  Administered 2017-06-22: 200 ug via INTRAVENOUS
  Administered 2017-06-22: 14:00:00 via INTRAVENOUS
  Administered 2017-06-22: 270 ug via INTRAVENOUS
  Administered 2017-06-22: 200 ug via INTRAVENOUS
  Administered 2017-06-22 – 2017-06-23 (×3): 220 ug via INTRAVENOUS
  Administered 2017-06-23: 70 ug via INTRAVENOUS
  Administered 2017-06-23: 400 ug via INTRAVENOUS
  Administered 2017-06-23: 08:00:00 via INTRAVENOUS
  Administered 2017-06-23: 240 ug via INTRAVENOUS
  Administered 2017-06-23: 300 ug via INTRAVENOUS
  Administered 2017-06-24: 1000 ug via INTRAVENOUS
  Administered 2017-06-24: 230 ug via INTRAVENOUS
  Administered 2017-06-25: 120 ug via INTRAVENOUS
  Filled 2017-06-21 (×5): qty 25

## 2017-06-21 MED ORDER — SODIUM CHLORIDE 0.9% FLUSH
9.0000 mL | INTRAVENOUS | Status: DC | PRN
  Administered 2017-07-06: 9 mL via INTRAVENOUS
  Filled 2017-06-21: qty 9

## 2017-06-21 MED ORDER — DIPHENHYDRAMINE HCL 50 MG/ML IJ SOLN: 12.5000 mg | Freq: Four times a day (QID) | INTRAMUSCULAR | Status: DC | PRN

## 2017-06-21 MED ORDER — ONDANSETRON HCL 4 MG/2ML IJ SOLN
4.0000 mg | Freq: Four times a day (QID) | INTRAMUSCULAR | Status: DC | PRN
  Administered 2017-07-05: 4 mg via INTRAVENOUS
  Filled 2017-06-21: qty 2

## 2017-06-21 MED ORDER — PANTOPRAZOLE SODIUM 40 MG PO PACK
40.0000 mg | PACK | Freq: Every day | ORAL | Status: DC
  Administered 2017-06-21: 40 mg
  Filled 2017-06-21: qty 20

## 2017-06-21 NOTE — Progress Notes (Signed)
Pt arrived to 4e from 4n. Pt oriented to room and staff. Pt placed on telemetry monitor and CCMD notified. Vitals stable. A-line monitor connected. Upon assessment, left chest tube sahara system needed to be changed; which was done. Bilateral chest tube dressing sites were changed. Pt's gown was soiled. Pt's gown was changed. Pt requested pain medication and was given fentanyl, per request.    Berdine DanceLauren Moffitt BSN, RN

## 2017-06-21 NOTE — Progress Notes (Signed)
SLP Cancellation Note  Patient Details Name: Mike [name] MRN: [id number] DOB: 07/28/1991   Cancelled treatment:       Reason Eval/Treat Not Completed: Patient at procedure or test/unavailable. Pt just arrived on 4E; RN entering room to drain chest tubes. Will follow up next date for cognitive-linguistic evaluation. Per chart review saw pt intubated 4 days, now on regular diet. Please order swallow evaluation if warranted.  Rondel BatonMary Beth Saaya Procell, [place]MS, CCC-SLP Speech-Language Pathologist [phone]   Mike [name] 06/21/2017, 4:25 PM
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
## 2017-06-21 NOTE — Progress Notes (Signed)
Orthopaedic Trauma Service (OTS)  2 Days Post-Op Procedure(s) (LRB): OPEN REDUCTION INTERNAL FIXATION (ORIF) PELVIC FRACTURE dressing change right arm and both legs  (N/A)  Subjective: Patient reports pain as severe. Glad to be extubated. Asking about going home.    Objective: Current Vitals Blood pressure 116/73, pulse 86, temperature 99 F (37.2 C), resp. rate 18, height 5\' 9"  (1.753 m), weight 79.9 kg (176 lb 2.4 oz), SpO2 99 %. Vital signs in last 24 hours: Temp:  [98.8 F (37.1 C)-100 F (37.8 C)] 99 F (37.2 C) (09/02 1100) Pulse Rate:  [86-126] 86 (09/02 1100) Resp:  [18-32] 18 (09/02 1100) BP: (113-182)/(70-97) 116/73 (09/02 1100) SpO2:  [92 %-100 %] 99 % (09/02 1100) Arterial Line BP: (114-187)/(62-86) 149/66 (09/02 1100)  Intake/Output from previous day: 09/01 0701 - 09/02 0700 In: 2954.7 [P.O.:840; I.V.:1784.7; NG/GT:330] Out: 2999 [Urine:2010; Drains:12; Chest Tube:977]  LABS  Recent Labs  06/19/17 1432 06/19/17 1614 06/19/17 2246 06/20/17 0459 06/21/17 0637  HGB 8.2* 8.5* 10.5* 10.1* 10.6*    Recent Labs  06/20/17 0459 06/21/17 0637  WBC 11.7* 11.5*  RBC 3.47* 3.56*  HCT 29.9* 31.0*  PLT 101* 130*    Recent Labs  06/20/17 0459 06/21/17 0637  NA 137 135  K 3.6 3.5  CL 104 99*  CO2 27 28  BUN 11 9  CREATININE 0.71 0.60*  GLUCOSE 146* 110*  CALCIUM 7.3* 7.8*   No results for input(s): LABPT, INR in the last 72 hours.   Physical Exam RUE   Sens  Ax/R/M/U intact  Mot   Ax/ R/ PIN/ M/ AIN/ U intact  Brisk CR, Rad 2+ R&LLE  Dressings intact, clean, dry  Edema/ swelling moderate  Sens: DPN, SPN, TN intact  Motor: EHL, FHL, and lessor toe ext and flex all intact grossly  Brisk cap refill, warm to touch, DP 2+ on right  Assessment/Plan: 2 Days Post-Op Procedure(s) (LRB): OPEN REDUCTION INTERNAL FIXATION (ORIF) PELVIC FRACTURE dressing change right arm and both legs  (N/A) 1. PT/OT NWB Bilat LE and NWB LRUE 2. DVT proph per Trauma  Service; recommend Lovenox 3. F/u 8-14 days  Myrene GalasMichael Anetta Olvera, MD [facility] [phone] (p)

## 2017-06-22 ENCOUNTER — Encounter (HOSPITAL_COMMUNITY): Payer: Self-pay | Admitting: *Deleted

## 2017-06-22 ENCOUNTER — Inpatient Hospital Stay (HOSPITAL_COMMUNITY): Payer: Medicaid Other

## 2017-06-22 LAB — GLUCOSE, CAPILLARY
GLUCOSE-CAPILLARY: 100 mg/dL — AB (ref 65–99)
GLUCOSE-CAPILLARY: 102 mg/dL — AB (ref 65–99)
GLUCOSE-CAPILLARY: 104 mg/dL — AB (ref 65–99)
GLUCOSE-CAPILLARY: 109 mg/dL — AB (ref 65–99)
GLUCOSE-CAPILLARY: 93 mg/dL (ref 65–99)
Glucose-Capillary: 113 mg/dL — ABNORMAL HIGH (ref 65–99)

## 2017-06-22 MED ORDER — ACETAMINOPHEN 325 MG PO TABS: 650.0000 mg | ORAL_TABLET | Freq: Four times a day (QID) | ORAL | Status: DC | PRN

## 2017-06-22 MED ORDER — SODIUM CHLORIDE 0.9 % IV BOLUS (SEPSIS)
500.0000 mL | Freq: Once | INTRAVENOUS | Status: AC
  Administered 2017-06-22: 500 mL via INTRAVENOUS

## 2017-06-22 MED ORDER — PANTOPRAZOLE SODIUM 40 MG PO TBEC
40.0000 mg | DELAYED_RELEASE_TABLET | Freq: Every day | ORAL | Status: DC
Start: 2017-06-22 — End: 2017-07-09
  Administered 2017-06-22 – 2017-07-09 (×18): 40 mg via ORAL
  Filled 2017-06-22 (×18): qty 1

## 2017-06-22 MED ORDER — TRAMADOL HCL 50 MG PO TABS
50.0000 mg | ORAL_TABLET | Freq: Four times a day (QID) | ORAL | Status: DC
  Administered 2017-06-22 – 2017-06-25 (×12): 50 mg via ORAL
  Filled 2017-06-22 (×12): qty 1
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
## 2017-06-22 NOTE — Progress Notes (Signed)
Vital signs are stable Neurologically is doing well No motor complaints No complaints of significant neck pain Some back pain
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
## 2017-06-22 NOTE — Plan of Care (Signed)
Problem: Pain Managment: Goal: General experience of comfort will improve Outcome: Not Progressing Pt sill complainng of significant pain, PCA, oxy, ultram

## 2017-06-22 NOTE — Progress Notes (Signed)
  Speech Language Pathology  Patient Details Name: Mike CoronaDerek B [name] MRN: [id number] DOB: 04/16/1991 Today's Date: 06/22/2017 Time: [id number] SLP Time Calculation (min) (ACUTE ONLY): 23 min    Pt intubated x 4 days, has decreasing positioning due to pain. He states he coughs "sometimes" when he eats/drinks. If staff observes increased difficulty with solids/liquids, please request ST order for swallow eval. Thanks   [name] M.Ed [employer]-SLP Pager [phone]

## 2017-06-22 NOTE — Progress Notes (Signed)
Pt HR 130's sustaining temp 100.8, paged Dr Janee Mornthompson who ordered 650 tylenol prn and 500 cc bolus

## 2017-06-22 NOTE — Progress Notes (Signed)
Rehab Admissions Coordinator Note:  Patient was screened by Clois DupesBoyette, Faith Patricelli Godwin for appropriateness for an Inpatient Acute Rehab Consult per PT recommendation.   At this time, we are recommending Inpatient Rehab consult.  Clois DupesBoyette, Lyndsey Demos Godwin 06/22/2017, 1:40 PM  I can be reached at [phone].

## 2017-06-23 ENCOUNTER — Inpatient Hospital Stay (HOSPITAL_COMMUNITY): Payer: Medicaid Other

## 2017-06-23 ENCOUNTER — Encounter (HOSPITAL_COMMUNITY): Payer: Self-pay | Admitting: General Practice

## 2017-06-23 DIAGNOSIS — D72829 Elevated white blood cell count, unspecified: Secondary | ICD-10-CM

## 2017-06-23 DIAGNOSIS — R0682 Tachypnea, not elsewhere classified: Secondary | ICD-10-CM

## 2017-06-23 DIAGNOSIS — S12600A Unspecified displaced fracture of seventh cervical vertebra, initial encounter for closed fracture: Secondary | ICD-10-CM

## 2017-06-23 DIAGNOSIS — I471 Supraventricular tachycardia: Secondary | ICD-10-CM

## 2017-06-23 DIAGNOSIS — T1490XA Injury, unspecified, initial encounter: Secondary | ICD-10-CM

## 2017-06-23 DIAGNOSIS — J939 Pneumothorax, unspecified: Secondary | ICD-10-CM

## 2017-06-23 DIAGNOSIS — Z87898 Personal history of other specified conditions: Secondary | ICD-10-CM

## 2017-06-23 DIAGNOSIS — J9690 Respiratory failure, unspecified, unspecified whether with hypoxia or hypercapnia: Secondary | ICD-10-CM

## 2017-06-23 DIAGNOSIS — G8918 Other acute postprocedural pain: Secondary | ICD-10-CM

## 2017-06-23 DIAGNOSIS — E871 Hypo-osmolality and hyponatremia: Secondary | ICD-10-CM

## 2017-06-23 DIAGNOSIS — F1111 Opioid abuse, in remission: Secondary | ICD-10-CM

## 2017-06-23 DIAGNOSIS — J9601 Acute respiratory failure with hypoxia: Secondary | ICD-10-CM

## 2017-06-23 DIAGNOSIS — S32409A Unspecified fracture of unspecified acetabulum, initial encounter for closed fracture: Secondary | ICD-10-CM

## 2017-06-23 DIAGNOSIS — S32409D Unspecified fracture of unspecified acetabulum, subsequent encounter for fracture with routine healing: Secondary | ICD-10-CM

## 2017-06-23 DIAGNOSIS — S12601D Unspecified nondisplaced fracture of seventh cervical vertebra, subsequent encounter for fracture with routine healing: Secondary | ICD-10-CM

## 2017-06-23 DIAGNOSIS — R739 Hyperglycemia, unspecified: Secondary | ICD-10-CM

## 2017-06-23 LAB — URINALYSIS, ROUTINE W REFLEX MICROSCOPIC
BILIRUBIN URINE: NEGATIVE
GLUCOSE, UA: NEGATIVE mg/dL
Ketones, ur: NEGATIVE mg/dL
NITRITE: NEGATIVE
PROTEIN: 100 mg/dL — AB
SQUAMOUS EPITHELIAL / LPF: NONE SEEN
Specific Gravity, Urine: 1.02 (ref 1.005–1.030)
pH: 7 (ref 5.0–8.0)

## 2017-06-23 LAB — GLUCOSE, CAPILLARY
GLUCOSE-CAPILLARY: 158 mg/dL — AB (ref 65–99)
GLUCOSE-CAPILLARY: 123 mg/dL — AB (ref 65–99)
GLUCOSE-CAPILLARY: 101 mg/dL — AB (ref 65–99)
Glucose-Capillary: 100 mg/dL — ABNORMAL HIGH (ref 65–99)
Glucose-Capillary: 104 mg/dL — ABNORMAL HIGH (ref 65–99)
Glucose-Capillary: 115 mg/dL — ABNORMAL HIGH (ref 65–99)

## 2017-06-23 LAB — BASIC METABOLIC PANEL
ANION GAP: 6 (ref 5–15)
BUN: 13 mg/dL (ref 6–20)
CALCIUM: 7.7 mg/dL — AB (ref 8.9–10.3)
CO2: 30 mmol/L (ref 22–32)
Chloride: 96 mmol/L — ABNORMAL LOW (ref 101–111)
Creatinine, Ser: 0.63 mg/dL (ref 0.61–1.24)
Glucose, Bld: 99 mg/dL (ref 65–99)
POTASSIUM: 4.4 mmol/L (ref 3.5–5.1)
Sodium: 132 mmol/L — ABNORMAL LOW (ref 135–145)

## 2017-06-23 LAB — POCT I-STAT 4, (NA,K, GLUC, HGB,HCT)
Glucose, Bld: 134 mg/dL — ABNORMAL HIGH (ref 65–99)
HCT: 20 % — ABNORMAL LOW (ref 39.0–52.0)
Hemoglobin: 6.8 g/dL — CL (ref 13.0–17.0)
Potassium: 3.7 mmol/L (ref 3.5–5.1)
Sodium: 139 mmol/L (ref 135–145)

## 2017-06-23 LAB — CBC
HEMATOCRIT: 31.7 % — AB (ref 39.0–52.0)
Hemoglobin: 10.7 g/dL — ABNORMAL LOW (ref 13.0–17.0)
MCH: 29.8 pg (ref 26.0–34.0)
MCHC: 33.8 g/dL (ref 30.0–36.0)
MCV: 88.3 fL (ref 78.0–100.0)
Platelets: 234 10*3/uL (ref 150–400)
RBC: 3.59 MIL/uL — AB (ref 4.22–5.81)
RDW: 13.7 % (ref 11.5–15.5)
WBC: 17.8 10*3/uL — AB (ref 4.0–10.5)

## 2017-06-23 MED ORDER — SODIUM CHLORIDE 0.9% FLUSH
10.0000 mL | INTRAVENOUS | Status: DC | PRN
  Administered 2017-06-23 – 2017-07-01 (×6): 10 mL
  Filled 2017-06-23 (×6): qty 40
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
## 2017-06-23 NOTE — Progress Notes (Signed)
Inpatient Rehabilitation  Please see IP Rehab consult by Dr. Allena KatzPatel for full details.  At this time, a lower level of intensity for post acute rehab is recommended due to significant non-weight bearing status.  I discussed recommendation with patient and father, who are in agreement with plan.  I will sign off at this time.  Please call if there are questions.   Mike FerrettiMelissa Annabella [name], M.A., CCC/SLP Admission Coordinator  [facility]  Cell [phone]

## 2017-06-24 ENCOUNTER — Inpatient Hospital Stay (HOSPITAL_COMMUNITY): Payer: Medicaid Other

## 2017-06-24 ENCOUNTER — Encounter (HOSPITAL_COMMUNITY): Payer: Self-pay | Admitting: Student

## 2017-06-24 LAB — BASIC METABOLIC PANEL
Anion gap: 6 (ref 5–15)
BUN: 11 mg/dL (ref 6–20)
CHLORIDE: 94 mmol/L — AB (ref 101–111)
CO2: 29 mmol/L (ref 22–32)
CREATININE: 0.63 mg/dL (ref 0.61–1.24)
Calcium: 7.7 mg/dL — ABNORMAL LOW (ref 8.9–10.3)
GFR calc Af Amer: >60 mL/min (ref >60)
Glucose, Bld: 116 mg/dL — ABNORMAL HIGH (ref 65–99)
Potassium: 4.2 mmol/L (ref 3.5–5.1)
SODIUM: 129 mmol/L — AB (ref 135–145)

## 2017-06-24 LAB — CBC
HCT: 30.7 % — ABNORMAL LOW (ref 39.0–52.0)
HEMOGLOBIN: 10.1 g/dL — AB (ref 13.0–17.0)
MCH: 29.3 pg (ref 26.0–34.0)
MCHC: 32.9 g/dL (ref 30.0–36.0)
MCV: 89 fL (ref 78.0–100.0)
Platelets: 297 10*3/uL (ref 150–400)
RBC: 3.45 MIL/uL — AB (ref 4.22–5.81)
RDW: 13.7 % (ref 11.5–15.5)
WBC: 16.8 10*3/uL — ABNORMAL HIGH (ref 4.0–10.5)

## 2017-06-24 LAB — GLUCOSE, CAPILLARY
GLUCOSE-CAPILLARY: 125 mg/dL — AB (ref 65–99)
Glucose-Capillary: 102 mg/dL — ABNORMAL HIGH (ref 65–99)
Glucose-Capillary: 112 mg/dL — ABNORMAL HIGH (ref 65–99)
Glucose-Capillary: 130 mg/dL — ABNORMAL HIGH (ref 65–99)

## 2017-06-24 MED ORDER — POLYETHYLENE GLYCOL 3350 17 G PO PACK
17.0000 g | PACK | Freq: Two times a day (BID) | ORAL | Status: DC
  Administered 2017-06-24 – 2017-07-08 (×21): 17 g via ORAL
  Filled 2017-06-24 (×25): qty 1

## 2017-06-24 NOTE — Progress Notes (Addendum)
Assumed care from off going RN @ 0708 am; patient alert & oriented; requested bath; addressed concerns with patient and will withTECH;  Patient off floor @0800  for chest xray via bed; RN went with patient  Patient was brought down for chest xray 2 view but was unable to sit up for LAT view of chest. Patient needs to be done portable at bedside for future references;   @0910  MD notified about chest xray results and addressed bowel movement regenment; see epic for changes
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
## 2017-06-25 ENCOUNTER — Inpatient Hospital Stay (HOSPITAL_COMMUNITY): Payer: Medicaid Other

## 2017-06-25 LAB — GLUCOSE, CAPILLARY: Glucose-Capillary: 125 mg/dL — ABNORMAL HIGH (ref 65–99)

## 2017-06-25 MED ORDER — TRAMADOL HCL 50 MG PO TABS
100.0000 mg | ORAL_TABLET | Freq: Four times a day (QID) | ORAL | Status: DC
  Administered 2017-06-25 – 2017-07-09 (×54): 100 mg via ORAL
  Filled 2017-06-25 (×56): qty 2

## 2017-06-25 MED ORDER — OXYCODONE HCL 5 MG PO TABS
5.0000 mg | ORAL_TABLET | ORAL | Status: DC | PRN
  Administered 2017-06-25 (×2): 15 mg via ORAL
  Administered 2017-06-25: 10 mg via ORAL
  Administered 2017-06-26 – 2017-06-30 (×21): 15 mg via ORAL
  Administered 2017-06-30: 10 mg via ORAL
  Administered 2017-06-30 (×2): 15 mg via ORAL
  Administered 2017-06-30: 10 mg via ORAL
  Administered 2017-07-01 – 2017-07-02 (×9): 15 mg via ORAL
  Filled 2017-06-25 (×16): qty 3
  Filled 2017-06-25: qty 2
  Filled 2017-06-25 (×10): qty 3
  Filled 2017-06-25: qty 2
  Filled 2017-06-25 (×7): qty 3
  Filled 2017-06-25: qty 2
  Filled 2017-06-25: qty 3

## 2017-06-25 MED ORDER — MAGNESIUM CITRATE PO SOLN
1.0000 | Freq: Once | ORAL | Status: AC
  Administered 2017-06-25: 1 via ORAL
  Filled 2017-06-25: qty 296

## 2017-06-25 MED ORDER — ENSURE ENLIVE PO LIQD
237.0000 mL | Freq: Three times a day (TID) | ORAL | Status: DC
  Administered 2017-06-25 – 2017-07-08 (×28): 237 mL via ORAL

## 2017-06-25 NOTE — Plan of Care (Signed)
Problem: Pain Managment: Goal: General experience of comfort will improve Outcome: Not Progressing Pt c/o of severe pain despite PCA, oxy, ultram

## 2017-06-25 NOTE — Plan of Care (Signed)
Problem: Bowel/Gastric: Goal: Will not experience complications related to bowel motility Outcome: Not Progressing Pt refusing to take Miralax and his mag citrate. Continuing to encourage pt to take these

## 2017-06-25 NOTE — Progress Notes (Addendum)
Patient ID: Mike [name], male   DOB: 03/15/1991, 26 y.o.   MRN: [id number] See other note. There was an air leak from his L CT connection. I took it down, re-did the connection, and taped well. No more air leak. Will D/C foley, give mag citrate, D/C PCA. Increase oxy scale and Ultram. I spoke with his parents. Violeta GelinasBurke Oluwasemilore Pascuzzi, MD, MPH, FACS Trauma: [phone] General Surgery: [phone]
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
## 2017-06-25 NOTE — Progress Notes (Signed)
Mike PiccoloDerek was seen today to evaluate his swelling to the RLE and appropriateness for OR. At this point he is still swollen and may not be ready for definitive ORIF. We reassess in the AM to see if he will be ready, I am doubtful.  Mike LoftsKevin P. Jurney Overacker, MD [facility] [phone] (phone)

## 2017-06-25 NOTE — Progress Notes (Signed)
Fentanyl PCA discontinued and remaining 12 ml of syringe wasted in sink with Maryjean KaKathy Allen, RN.

## 2017-06-26 ENCOUNTER — Inpatient Hospital Stay (HOSPITAL_COMMUNITY): Payer: Medicaid Other

## 2017-06-26 LAB — GLUCOSE, CAPILLARY: GLUCOSE-CAPILLARY: 129 mg/dL — AB (ref 65–99)

## 2017-06-26 LAB — URINALYSIS, ROUTINE W REFLEX MICROSCOPIC
BILIRUBIN URINE: NEGATIVE
GLUCOSE, UA: NEGATIVE mg/dL
Hgb urine dipstick: NEGATIVE
Ketones, ur: NEGATIVE mg/dL
Leukocytes, UA: NEGATIVE
Nitrite: NEGATIVE
Protein, ur: NEGATIVE mg/dL
SPECIFIC GRAVITY, URINE: 1.011 (ref 1.005–1.030)
pH: 7 (ref 5.0–8.0)

## 2017-06-26 LAB — CBC
HCT: 32.6 % — ABNORMAL LOW (ref 39.0–52.0)
Hemoglobin: 10.6 g/dL — ABNORMAL LOW (ref 13.0–17.0)
MCH: 29.9 pg (ref 26.0–34.0)
MCHC: 32.5 g/dL (ref 30.0–36.0)
MCV: 91.8 fL (ref 78.0–100.0)
PLATELETS: 571 10*3/uL — AB (ref 150–400)
RBC: 3.55 MIL/uL — AB (ref 4.22–5.81)
RDW: 14 % (ref 11.5–15.5)
WBC: 18.2 10*3/uL — AB (ref 4.0–10.5)

## 2017-06-26 LAB — BASIC METABOLIC PANEL
Anion gap: 8 (ref 5–15)
BUN: 9 mg/dL (ref 6–20)
CALCIUM: 8.3 mg/dL — AB (ref 8.9–10.3)
CHLORIDE: 98 mmol/L — AB (ref 101–111)
CO2: 31 mmol/L (ref 22–32)
CREATININE: 0.59 mg/dL — AB (ref 0.61–1.24)
GFR calc non Af Amer: >60 mL/min (ref >60)
Glucose, Bld: 110 mg/dL — ABNORMAL HIGH (ref 65–99)
Potassium: 4.3 mmol/L (ref 3.5–5.1)
SODIUM: 137 mmol/L (ref 135–145)

## 2017-06-26 MED ORDER — ACETAMINOPHEN 325 MG PO TABS
650.0000 mg | ORAL_TABLET | Freq: Four times a day (QID) | ORAL | Status: DC
  Administered 2017-06-26 – 2017-07-09 (×49): 650 mg via ORAL
  Filled 2017-06-26 (×49): qty 2

## 2017-06-26 MED ORDER — CHLORHEXIDINE GLUCONATE CLOTH 2 % EX PADS
6.0000 | MEDICATED_PAD | Freq: Every day | CUTANEOUS | Status: AC
  Administered 2017-06-26 – 2017-06-30 (×3): 6 via TOPICAL

## 2017-06-26 MED ORDER — MUPIROCIN 2 % EX OINT
1.0000 "application " | TOPICAL_OINTMENT | Freq: Two times a day (BID) | CUTANEOUS | Status: AC
  Administered 2017-06-26 – 2017-06-30 (×9): 1 via NASAL
  Filled 2017-06-26 (×2): qty 22

## 2017-06-26 MED ORDER — MORPHINE SULFATE (PF) 2 MG/ML IV SOLN
2.0000 mg | INTRAVENOUS | Status: DC | PRN
  Administered 2017-06-26 – 2017-06-30 (×20): 2 mg via INTRAVENOUS
  Filled 2017-06-26 (×20): qty 1

## 2017-06-26 MED ORDER — IOPAMIDOL (ISOVUE-300) INJECTION 61%
INTRAVENOUS | Status: AC
  Administered 2017-06-26: 75 mL
  Filled 2017-06-26: qty 75
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
## 2017-06-26 NOTE — Procedures (Signed)
06/16/2017 - 06/19/2017  3:57 PM  PATIENT:  Mike [name]  26 y.o. male  PRE-PROCEDURE DIAGNOSIS:  Left pneumothorax  POST-PROCEDURE DIAGNOSIS:  Same  PROCEDURE: Left chest tube placement, pigtail  SURGEON: Stephanie Couphristopher M. Cliffton AstersWhite, MD  ASSISTANT: Mattie MarlinJessica Focht PA-C   ANESTHESIA: Local  EBL:0cc  DRAINS: Left chest pigtail   INDICATION: 59M with persistent left sided pneumothorax despite well placed functioning chest tube.  DESCRIPTION: The patient was identified in his room and a timeout performed. The anterior chest wall was prepped and draped in the standard sterile fashion. A left subclavian central line was in place so the 3rd intercostal space in the mid clavicular line was chosen. 10mL of 1% lidocaine was infiltrated into the subcutaneous tissue and chest wall. A 14Fr pigtail was placed using the Seldinger technique without difficulty. The tubing was connected to a pleuravac device at 20cm H2O. A post-placement chest xray was ordered. The patient tolerated the procedure well.  Stephanie Couphristopher M. Cliffton AstersWhite, M.D. [facility]
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
## 2017-06-27 ENCOUNTER — Inpatient Hospital Stay (HOSPITAL_COMMUNITY): Payer: Medicaid Other

## 2017-06-27 DIAGNOSIS — J9311 Primary spontaneous pneumothorax: Secondary | ICD-10-CM

## 2017-06-27 LAB — URINE CULTURE
CULTURE: NO GROWTH
SPECIAL REQUESTS: NORMAL
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
## 2017-06-27 NOTE — Plan of Care (Signed)
Problem: Bowel/Gastric: Goal: Gastrointestinal status for postoperative course will improve Outcome: Not Progressing Pt continues to refuse Miralax. Will report to off going RN to update MD.
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
## 2017-06-28 ENCOUNTER — Inpatient Hospital Stay (HOSPITAL_COMMUNITY): Payer: Medicaid Other

## 2017-06-28 LAB — CBC
HCT: 26.7 % — ABNORMAL LOW (ref 39.0–52.0)
HEMOGLOBIN: 8.6 g/dL — AB (ref 13.0–17.0)
MCH: 29.6 pg (ref 26.0–34.0)
MCHC: 32.2 g/dL (ref 30.0–36.0)
MCV: 91.8 fL (ref 78.0–100.0)
PLATELETS: 641 10*3/uL — AB (ref 150–400)
RBC: 2.91 MIL/uL — AB (ref 4.22–5.81)
RDW: 13.9 % (ref 11.5–15.5)
WBC: 14.2 10*3/uL — AB (ref 4.0–10.5)

## 2017-06-28 LAB — BASIC METABOLIC PANEL
ANION GAP: 6 (ref 5–15)
BUN: 10 mg/dL (ref 6–20)
CHLORIDE: 99 mmol/L — AB (ref 101–111)
CO2: 31 mmol/L (ref 22–32)
CREATININE: 0.6 mg/dL — AB (ref 0.61–1.24)
Calcium: 8.1 mg/dL — ABNORMAL LOW (ref 8.9–10.3)
GFR calc non Af Amer: >60 mL/min (ref >60)
Glucose, Bld: 97 mg/dL (ref 65–99)
Potassium: 3.9 mmol/L (ref 3.5–5.1)
SODIUM: 136 mmol/L (ref 135–145)

## 2017-06-28 LAB — CULTURE, BLOOD (ROUTINE X 2)
Culture: NO GROWTH
Culture: NO GROWTH
SPECIAL REQUESTS: ADEQUATE
Special Requests: ADEQUATE

## 2017-06-28 LAB — GLUCOSE, CAPILLARY
GLUCOSE-CAPILLARY: 102 mg/dL — AB (ref 65–99)
Glucose-Capillary: 108 mg/dL — ABNORMAL HIGH (ref 65–99)

## 2017-06-28 MED ORDER — BISACODYL 10 MG RE SUPP
10.0000 mg | Freq: Every day | RECTAL | Status: DC | PRN
  Filled 2017-06-28: qty 1

## 2017-06-28 NOTE — Progress Notes (Signed)
L CT d/c'd per order and per protocol. Pt tolerated fair. Pt educated to to let RN know if bandage feels "wet". Call bell and phone within reach. Will continue to monitor.
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
## 2017-06-29 ENCOUNTER — Inpatient Hospital Stay (HOSPITAL_COMMUNITY): Payer: Medicaid Other | Admitting: Certified Registered"

## 2017-06-29 ENCOUNTER — Inpatient Hospital Stay (HOSPITAL_COMMUNITY): Payer: Medicaid Other

## 2017-06-29 ENCOUNTER — Encounter (HOSPITAL_COMMUNITY): Admission: EM | Disposition: A | Discharge status: Home Health | Payer: Self-pay | Source: Home / Self Care

## 2017-06-29 ENCOUNTER — Encounter (HOSPITAL_COMMUNITY): Payer: Self-pay | Admitting: Certified Registered"

## 2017-06-29 HISTORY — PX: METATARSAL OSTEOTOMY WITH OPEN REDUCTION INTERNAL FIXATION (ORIF) METATARSAL WITH FUSION: SHX5692

## 2017-06-29 HISTORY — PX: ORIF ANKLE FRACTURE: SHX5408

## 2017-06-29 LAB — CBC
HCT: 27.1 % — ABNORMAL LOW (ref 39.0–52.0)
Hemoglobin: 8.8 g/dL — ABNORMAL LOW (ref 13.0–17.0)
MCH: 29.8 pg (ref 26.0–34.0)
MCHC: 32.5 g/dL (ref 30.0–36.0)
MCV: 91.9 fL (ref 78.0–100.0)
PLATELETS: 730 10*3/uL — AB (ref 150–400)
RBC: 2.95 MIL/uL — ABNORMAL LOW (ref 4.22–5.81)
RDW: 13.9 % (ref 11.5–15.5)
WBC: 13.3 10*3/uL — AB (ref 4.0–10.5)

## 2017-06-29 LAB — BASIC METABOLIC PANEL
Anion gap: 4 — ABNORMAL LOW (ref 5–15)
BUN: 9 mg/dL (ref 6–20)
CO2: 31 mmol/L (ref 22–32)
Calcium: 8.5 mg/dL — ABNORMAL LOW (ref 8.9–10.3)
Chloride: 101 mmol/L (ref 101–111)
Creatinine, Ser: 0.61 mg/dL (ref 0.61–1.24)
GFR calc Af Amer: >60 mL/min (ref >60)
Glucose, Bld: 98 mg/dL (ref 65–99)
Potassium: 4 mmol/L (ref 3.5–5.1)
SODIUM: 136 mmol/L (ref 135–145)

## 2017-06-29 SURGERY — OPEN REDUCTION INTERNAL FIXATION (ORIF) ANKLE FRACTURE
Anesthesia: General | Site: Foot | Laterality: Right

## 2017-06-29 MED ORDER — CEFAZOLIN SODIUM-DEXTROSE 2-3 GM-% IV SOLR
INTRAVENOUS | Status: DC | PRN
  Administered 2017-06-29: 2 g via INTRAVENOUS

## 2017-06-29 MED ORDER — MEPERIDINE HCL 25 MG/ML IJ SOLN
INTRAMUSCULAR | Status: AC
  Administered 2017-06-29: 12.5 mg via INTRAVENOUS
  Filled 2017-06-29: qty 1

## 2017-06-29 MED ORDER — PROPOFOL 10 MG/ML IV BOLUS
INTRAVENOUS | Status: AC
  Filled 2017-06-29: qty 20

## 2017-06-29 MED ORDER — MIDAZOLAM HCL 2 MG/2ML IJ SOLN
INTRAMUSCULAR | Status: AC
  Filled 2017-06-29: qty 2

## 2017-06-29 MED ORDER — FENTANYL CITRATE (PF) 100 MCG/2ML IJ SOLN
INTRAMUSCULAR | Status: DC | PRN
  Administered 2017-06-29 (×10): 50 ug via INTRAVENOUS
  Administered 2017-06-29: 100 ug via INTRAVENOUS
  Administered 2017-06-29: 150 ug via INTRAVENOUS

## 2017-06-29 MED ORDER — BACITRACIN ZINC 500 UNIT/GM EX OINT
TOPICAL_OINTMENT | CUTANEOUS | Status: DC | PRN
  Administered 2017-06-29: 1 via TOPICAL

## 2017-06-29 MED ORDER — FENTANYL CITRATE (PF) 250 MCG/5ML IJ SOLN
INTRAMUSCULAR | Status: AC
  Filled 2017-06-29: qty 5

## 2017-06-29 MED ORDER — LIDOCAINE 2% (20 MG/ML) 5 ML SYRINGE
INTRAMUSCULAR | Status: AC
  Filled 2017-06-29: qty 5

## 2017-06-29 MED ORDER — BACITRACIN ZINC 500 UNIT/GM EX OINT
TOPICAL_OINTMENT | CUTANEOUS | Status: AC
  Filled 2017-06-29: qty 28.35

## 2017-06-29 MED ORDER — ONDANSETRON HCL 4 MG/2ML IJ SOLN
INTRAMUSCULAR | Status: DC | PRN
  Administered 2017-06-29: 4 mg via INTRAVENOUS

## 2017-06-29 MED ORDER — ROCURONIUM BROMIDE 100 MG/10ML IV SOLN
INTRAVENOUS | Status: DC | PRN
  Administered 2017-06-29: 10 mg via INTRAVENOUS
  Administered 2017-06-29: 20 mg via INTRAVENOUS
  Administered 2017-06-29 (×2): 10 mg via INTRAVENOUS
  Administered 2017-06-29: 50 mg via INTRAVENOUS

## 2017-06-29 MED ORDER — LIDOCAINE 2% (20 MG/ML) 5 ML SYRINGE
INTRAMUSCULAR | Status: DC | PRN
  Administered 2017-06-29: 100 mg via INTRAVENOUS

## 2017-06-29 MED ORDER — HYDROMORPHONE HCL 1 MG/ML IJ SOLN
INTRAMUSCULAR | Status: AC
  Administered 2017-06-29: 1 mg via INTRAVENOUS
  Filled 2017-06-29: qty 2

## 2017-06-29 MED ORDER — DEXAMETHASONE SODIUM PHOSPHATE 10 MG/ML IJ SOLN
INTRAMUSCULAR | Status: DC | PRN
  Administered 2017-06-29: 8 mg via INTRAVENOUS

## 2017-06-29 MED ORDER — MIDAZOLAM HCL 5 MG/5ML IJ SOLN
INTRAMUSCULAR | Status: DC | PRN
  Administered 2017-06-29: 2 mg via INTRAVENOUS

## 2017-06-29 MED ORDER — SUGAMMADEX SODIUM 200 MG/2ML IV SOLN
INTRAVENOUS | Status: AC
  Filled 2017-06-29: qty 2

## 2017-06-29 MED ORDER — METOCLOPRAMIDE HCL 5 MG/ML IJ SOLN: 10.0000 mg | Freq: Once | INTRAMUSCULAR | Status: DC | PRN

## 2017-06-29 MED ORDER — LACTATED RINGERS IV SOLN
INTRAVENOUS | Status: DC | PRN
  Administered 2017-06-29 (×2): via INTRAVENOUS

## 2017-06-29 MED ORDER — PHENYLEPHRINE 40 MCG/ML (10ML) SYRINGE FOR IV PUSH (FOR BLOOD PRESSURE SUPPORT)
PREFILLED_SYRINGE | INTRAVENOUS | Status: AC
  Filled 2017-06-29: qty 10

## 2017-06-29 MED ORDER — HYDROMORPHONE HCL 1 MG/ML IJ SOLN
1.0000 mg | INTRAMUSCULAR | Status: DC | PRN
  Administered 2017-06-29 (×5): 1 mg via INTRAVENOUS

## 2017-06-29 MED ORDER — ROCURONIUM BROMIDE 10 MG/ML (PF) SYRINGE
PREFILLED_SYRINGE | INTRAVENOUS | Status: AC
  Filled 2017-06-29: qty 5

## 2017-06-29 MED ORDER — ONDANSETRON HCL 4 MG/2ML IJ SOLN
INTRAMUSCULAR | Status: AC
  Filled 2017-06-29: qty 2

## 2017-06-29 MED ORDER — PROPOFOL 10 MG/ML IV BOLUS
INTRAVENOUS | Status: DC | PRN
  Administered 2017-06-29: 140 mg via INTRAVENOUS

## 2017-06-29 MED ORDER — VANCOMYCIN HCL 1000 MG IV SOLR
INTRAVENOUS | Status: AC
  Filled 2017-06-29: qty 1000

## 2017-06-29 MED ORDER — HYDROMORPHONE HCL 1 MG/ML IJ SOLN
INTRAMUSCULAR | Status: AC
  Administered 2017-06-29: 1 mg via INTRAVENOUS
  Filled 2017-06-29: qty 1

## 2017-06-29 MED ORDER — DEXAMETHASONE SODIUM PHOSPHATE 10 MG/ML IJ SOLN
INTRAMUSCULAR | Status: AC
  Filled 2017-06-29: qty 1

## 2017-06-29 MED ORDER — EPHEDRINE 5 MG/ML INJ
INTRAVENOUS | Status: AC
  Filled 2017-06-29: qty 10

## 2017-06-29 MED ORDER — MEPERIDINE HCL 25 MG/ML IJ SOLN
6.2500 mg | INTRAMUSCULAR | Status: DC | PRN
  Administered 2017-06-29 (×2): 12.5 mg via INTRAVENOUS

## 2017-06-29 MED ORDER — CEFAZOLIN SODIUM 1 G IJ SOLR
INTRAMUSCULAR | Status: AC
  Filled 2017-06-29: qty 20

## 2017-06-29 MED ORDER — 0.9 % SODIUM CHLORIDE (POUR BTL) OPTIME
TOPICAL | Status: DC | PRN
Start: 2017-06-29 — End: 2017-06-29
  Administered 2017-06-29: 1000 mL

## 2017-06-29 MED ORDER — LACTATED RINGERS IV SOLN: INTRAVENOUS | Status: DC

## 2017-06-29 MED ORDER — SUGAMMADEX SODIUM 200 MG/2ML IV SOLN
INTRAVENOUS | Status: DC | PRN
  Administered 2017-06-29: 200 mg via INTRAVENOUS

## 2017-06-29 MED ORDER — SUCCINYLCHOLINE CHLORIDE 200 MG/10ML IV SOSY
PREFILLED_SYRINGE | INTRAVENOUS | Status: AC
  Filled 2017-06-29: qty 10

## 2017-06-29 SURGICAL SUPPLY — 83 items
BANDAGE ACE 4X5 VEL STRL LF (GAUZE/BANDAGES/DRESSINGS) ×3 IMPLANT
BANDAGE ACE 6X5 VEL STRL LF (GAUZE/BANDAGES/DRESSINGS) ×3 IMPLANT
BANDAGE ESMARK 6X9 LF (GAUZE/BANDAGES/DRESSINGS) ×2 IMPLANT
BIT DRILL 2.5 X LONG (BIT) ×2
BIT DRILL LCP QC 2X140 (BIT) ×3 IMPLANT
BIT DRILL QC 3.5X110 (BIT) ×3 IMPLANT
BIT DRILL X LONG 2.5 (BIT) ×2 IMPLANT
BLADE SURG 10 STRL SS (BLADE) ×3 IMPLANT
BLADE SURG 15 STRL LF DISP TIS (BLADE) ×4 IMPLANT
BLADE SURG 15 STRL SS (BLADE) ×2
BNDG ESMARK 6X9 LF (GAUZE/BANDAGES/DRESSINGS) ×3
BNDG GAUZE ELAST 4 BULKY (GAUZE/BANDAGES/DRESSINGS) ×6 IMPLANT
BRUSH SCRUB SURG 4.25 DISP (MISCELLANEOUS) ×6 IMPLANT
CHLORAPREP W/TINT 26ML (MISCELLANEOUS) ×3 IMPLANT
COVER MAYO STAND STRL (DRAPES) ×3 IMPLANT
COVER SURGICAL LIGHT HANDLE (MISCELLANEOUS) ×6 IMPLANT
DRAPE C-ARM 42X72 X-RAY (DRAPES) IMPLANT
DRAPE C-ARMOR (DRAPES) ×9 IMPLANT
DRAPE HALF SHEET 40X57 (DRAPES) ×6 IMPLANT
DRAPE ORTHO SPLIT 77X108 STRL (DRAPES) ×4
DRAPE SURG ORHT 6 SPLT 77X108 (DRAPES) ×8 IMPLANT
DRAPE U-SHAPE 47X51 STRL (DRAPES) ×3 IMPLANT
DRESSING PREVENA PLUS CUSTOM (GAUZE/BANDAGES/DRESSINGS) ×4 IMPLANT
DRILL BIT X LONG 2.5 (BIT) ×1
DRSG ADAPTIC 3X8 NADH LF (GAUZE/BANDAGES/DRESSINGS) ×6 IMPLANT
DRSG EMULSION OIL 3X3 NADH (GAUZE/BANDAGES/DRESSINGS) IMPLANT
DRSG PREVENA PLUS CUSTOM (GAUZE/BANDAGES/DRESSINGS) ×6
ELECT REM PT RETURN 9FT ADLT (ELECTROSURGICAL) ×3
ELECTRODE REM PT RTRN 9FT ADLT (ELECTROSURGICAL) ×2 IMPLANT
GAUZE SPONGE 4X4 12PLY STRL (GAUZE/BANDAGES/DRESSINGS) ×3 IMPLANT
GAUZE SPONGE 4X4 12PLY STRL LF (GAUZE/BANDAGES/DRESSINGS) ×3 IMPLANT
GLOVE BIO SURGEON STRL SZ 6.5 (GLOVE) ×12 IMPLANT
GLOVE BIO SURGEON STRL SZ7.5 (GLOVE) ×24 IMPLANT
GLOVE BIOGEL PI IND STRL 7.5 (GLOVE) ×10 IMPLANT
GLOVE BIOGEL PI INDICATOR 7.5 (GLOVE) ×5
GLOVE ECLIPSE 6.0 STRL STRAW (GLOVE) ×3 IMPLANT
GLOVE ECLIPSE 8.0 STRL XLNG CF (GLOVE) ×3 IMPLANT
GLOVE INDICATOR 7.5 STRL GRN (GLOVE) ×3 IMPLANT
GLOVE SURG SS PI 6.5 STRL IVOR (GLOVE) ×6 IMPLANT
GOWN STRL REUS W/ TWL LRG LVL3 (GOWN DISPOSABLE) ×4 IMPLANT
GOWN STRL REUS W/TWL LRG LVL3 (GOWN DISPOSABLE) ×2
K-WIRE 1.6X150 (WIRE) ×3
KIT ROOM TURNOVER OR (KITS) ×3 IMPLANT
KWIRE 1.6X150 (WIRE) ×2 IMPLANT
MANIFOLD NEPTUNE II (INSTRUMENTS) ×3 IMPLANT
NEEDLE HYPO 21X1.5 SAFETY (NEEDLE) IMPLANT
NS IRRIG 1000ML POUR BTL (IV SOLUTION) ×3 IMPLANT
PACK TOTAL JOINT (CUSTOM PROCEDURE TRAY) ×3 IMPLANT
PAD ARMBOARD 7.5X6 YLW CONV (MISCELLANEOUS) ×9 IMPLANT
PAD CAST 4YDX4 CTTN HI CHSV (CAST SUPPLIES) ×6 IMPLANT
PADDING CAST COTTON 4X4 STRL (CAST SUPPLIES) ×3
PADDING CAST COTTON 6X4 STRL (CAST SUPPLIES) ×12 IMPLANT
PENCIL BUTTON HOLSTER BLD 10FT (ELECTRODE) ×3 IMPLANT
PLATE DIST FIB 131MM 7H (Plate) ×3 IMPLANT
SCREW CORTEX 2.7 SLF-TPNG 16MM (Screw) ×3 IMPLANT
SCREW CORTEX 2.7 SLF-TPNG 18MM (Screw) ×3 IMPLANT
SCREW CORTEX 3.5X60 (Screw) ×3 IMPLANT
SCREW LOCK STARDRIVE 3.5X52 (Screw) ×3 IMPLANT
SCREW LOCKING 2.7X16MM VA (Screw) ×6 IMPLANT
SCREW METAPHYSCAL 18MM (Screw) ×3 IMPLANT
SCREW NON LOCK 2.7X16MM (Screw) ×6 IMPLANT
SCREW SELF TAP 12M (Screw) ×6 IMPLANT
SCREW SELF TAP 14MM (Screw) ×3 IMPLANT
SPLINT FIBERGLASS 4X30 (CAST SUPPLIES) ×6 IMPLANT
SPONGE LAP 18X18 X RAY DECT (DISPOSABLE) ×3 IMPLANT
STAPLER VISISTAT 35W (STAPLE) IMPLANT
SUCTION FRAZIER HANDLE 10FR (MISCELLANEOUS) ×1
SUCTION TUBE FRAZIER 10FR DISP (MISCELLANEOUS) ×2 IMPLANT
SUT ETHILON 3 0 PS 1 (SUTURE) ×15 IMPLANT
SUT MNCRL AB 3-0 PS2 18 (SUTURE) ×3 IMPLANT
SUT MON AB 2-0 CT1 36 (SUTURE) ×6 IMPLANT
SUT PDS AB 0 CT 36 (SUTURE) ×3 IMPLANT
SUT PDS AB 2-0 CT1 27 (SUTURE) IMPLANT
SUT PROLENE 0 CT (SUTURE) IMPLANT
SUT VIC AB 2-0 CT1 27 (SUTURE) ×3
SUT VIC AB 2-0 CT1 TAPERPNT 27 (SUTURE) ×6 IMPLANT
TOWEL OR 17X24 6PK STRL BLUE (TOWEL DISPOSABLE) ×3 IMPLANT
TOWEL OR 17X26 10 PK STRL BLUE (TOWEL DISPOSABLE) ×6 IMPLANT
TUBE CONNECTING 12X1/4 (SUCTIONS) ×3 IMPLANT
UNDERPAD 30X30 (UNDERPADS AND DIAPERS) ×6 IMPLANT
WATER STERILE IRR 1000ML POUR (IV SOLUTION) ×3 IMPLANT
WND VAC CANISTER 500ML (MISCELLANEOUS) ×3 IMPLANT
YANKAUER SUCT BULB TIP NO VENT (SUCTIONS) ×3 IMPLANT

## 2017-06-29 NOTE — Progress Notes (Signed)
PT Cancellation Note  Patient Details Name: Mike [name] MRN: [id number] DOB: 10/24/1990   Cancelled Treatment:    Reason Eval/Treat Not Completed: Patient at procedure or test/unavailable. Will continue to follow and progress as able per PT POC.    Marylynn PearsonLaura D Starling Jessie 06/29/2017, 7:09 AM   Conni SlipperLaura Araiyah Cumpton, PT, DPT Acute Rehabilitation Services Pager: [phone]
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
## 2017-06-29 NOTE — Interval H&P Note (Signed)
History and Physical Interval Note:  06/29/2017 7:02 AM  Mike [name]  has presented today for surgery, with the diagnosis of Open ankle fracture  The various methods of treatment have been discussed with the patient and family. After consideration of risks, benefits and other options for treatment, the patient has consented to  Procedure(s): OPEN REDUCTION INTERNAL FIXATION (ORIF) ANKLE FRACTURE (Right) as a surgical intervention .  The patient's history has been reviewed, patient examined, no change in status, stable for surgery.  I have reviewed the patient's chart and labs.  Questions were answered to the patient's satisfaction.     Haddix, Gillie MannersKevin P

## 2017-06-29 NOTE — Anesthesia Procedure Notes (Signed)
Procedure Name: Intubation Date/Time: 06/29/2017 7:35 AM Performed by: Julian ReilWELTY, Dorita Rowlands F Pre-anesthesia Checklist: Patient identified, Emergency Drugs available, Suction available, Patient being monitored and Timeout performed Patient Re-evaluated:Patient Re-evaluated prior to induction Oxygen Delivery Method: Circle system utilized Preoxygenation: Pre-oxygenation with 100% oxygen Induction Type: IV induction Ventilation: Mask ventilation without difficulty Laryngoscope Size: Miller and 3 Grade View: Grade I Tube type: Oral Tube size: 7.5 mm Number of attempts: 1 Airway Equipment and Method: Stylet Placement Confirmation: ETT inserted through vocal cords under direct vision,  positive ETCO2 and breath sounds checked- equal and bilateral Secured at: 23 cm Tube secured with: Tape Dental Injury: Teeth and Oropharynx as per pre-operative assessment  Comments: 4x4s bite block used.

## 2017-06-29 NOTE — Progress Notes (Signed)
Pt transported to OR holding with OCT and RN, VSS. Central tele called and updated on status.

## 2017-06-29 NOTE — Progress Notes (Signed)
Orthopedic Tech Progress Note Patient Details:  Leonia CoronaDerek B Shain 06/15/1991 [id number] Brace completed by bio-tech. Patient ID: Leonia Coronaerek B Bauserman, male   DOB: 06/01/1991, 26 y.o.   MRN: [id number]   Jennye MoccasinHughes, Denya Buckingham Craig 06/29/2017, 6:29 PM

## 2017-06-29 NOTE — Anesthesia Postprocedure Evaluation (Signed)
Anesthesia Post Note  Patient: Mike CoronaDerek B Osso  Procedure(s) Performed: Procedure(s) (LRB): OPEN REDUCTION INTERNAL FIXATION (ORIF) ANKLE FRACTURE (Right) METATARSAL OSTEOTOMY WITH OPEN REDUCTION, INTERNAL FIXATION (ORIF) METATARSAL WITH FUSION (Left)     Patient location during evaluation: PACU Anesthesia Type: General Level of consciousness: awake and alert Pain management: pain level controlled Vital Signs Assessment: post-procedure vital signs reviewed and stable Respiratory status: spontaneous breathing, nonlabored ventilation, respiratory function stable and patient connected to nasal cannula oxygen Cardiovascular status: blood pressure returned to baseline and stable Postop Assessment: no signs of nausea or vomiting Anesthetic complications: no    Last Vitals:  Vitals:   06/29/17 1140 06/29/17 1155  BP: 139/85 (!) 138/94  Pulse: (!) 116 (!) 111  Resp:  12  Temp: 36.7 C   SpO2: 100% 100%    Last Pain:  Vitals:   06/29/17 1140  TempSrc:   PainSc: 7                  Phillips Groutarignan, [place] Kirchner
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
## 2017-06-29 NOTE — Progress Notes (Signed)
Orthopedic Tech Progress Note Patient Details:  Elek B Nightengale 10/26/1990 [id number] [place] [facility] [employer] for brace. Patient ID: Leonia Coronaerek B Descoteaux, male   DOB: 09/16/1991, 26 y.o.   MRN: [id number]   Jennye MoccasinHughes, Keelin Neville Craig 06/29/2017, 4:15 PM

## 2017-06-29 NOTE — Transfer of Care (Signed)
Immediate Anesthesia Transfer of Care Note  Patient: Mike CoronaDerek B Noxon  Procedure(s) Performed: Procedure(s): OPEN REDUCTION INTERNAL FIXATION (ORIF) ANKLE FRACTURE (Right) METATARSAL OSTEOTOMY WITH OPEN REDUCTION, INTERNAL FIXATION (ORIF) METATARSAL WITH FUSION (Left)  Patient Location: PACU  Anesthesia Type:General  Level of Consciousness: awake and patient cooperative  Airway & Oxygen Therapy: Patient Spontanous Breathing and Patient connected to nasal cannula oxygen  Post-op Assessment: Report given to RN and Post -op Vital signs reviewed and stable  Post vital signs: Reviewed and stable  Last Vitals:  Vitals:   06/29/17 0000 06/29/17 0400  BP: 114/72 118/68  Pulse: 95 96  Resp: 12 15  Temp:    SpO2: 97% 96%    Last Pain:  Vitals:   06/29/17 0400  TempSrc:   PainSc: Asleep      Patients Stated Pain Goal: 5 (06/28/17 2000)  Complications: No apparent anesthesia complications

## 2017-06-30 ENCOUNTER — Inpatient Hospital Stay (HOSPITAL_COMMUNITY): Payer: Medicaid Other

## 2017-06-30 LAB — CBC
HCT: 26 % — ABNORMAL LOW (ref 39.0–52.0)
Hemoglobin: 8.4 g/dL — ABNORMAL LOW (ref 13.0–17.0)
MCH: 29.9 pg (ref 26.0–34.0)
MCHC: 32.3 g/dL (ref 30.0–36.0)
MCV: 92.5 fL (ref 78.0–100.0)
PLATELETS: 786 10*3/uL — AB (ref 150–400)
RBC: 2.81 MIL/uL — ABNORMAL LOW (ref 4.22–5.81)
RDW: 14.4 % (ref 11.5–15.5)
WBC: 13.4 10*3/uL — ABNORMAL HIGH (ref 4.0–10.5)

## 2017-06-30 MED ORDER — MORPHINE SULFATE (PF) 4 MG/ML IV SOLN
4.0000 mg | INTRAVENOUS | Status: DC | PRN
  Administered 2017-06-30 – 2017-07-04 (×23): 4 mg via INTRAVENOUS
  Filled 2017-06-30 (×23): qty 1
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
## 2017-06-30 NOTE — Plan of Care (Signed)
Problem: Pain Managment: Goal: General experience of comfort will improve Outcome: Progressing Asks for pain meds when needed. Needs education on pain med frequency
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
## 2017-07-01 ENCOUNTER — Inpatient Hospital Stay (HOSPITAL_COMMUNITY): Payer: Medicaid Other

## 2017-07-01 LAB — BASIC METABOLIC PANEL
Anion gap: 5 (ref 5–15)
BUN: 9 mg/dL (ref 6–20)
CALCIUM: 8.7 mg/dL — AB (ref 8.9–10.3)
CO2: 30 mmol/L (ref 22–32)
CREATININE: 0.62 mg/dL (ref 0.61–1.24)
Chloride: 100 mmol/L — ABNORMAL LOW (ref 101–111)
GFR calc Af Amer: >60 mL/min (ref >60)
Glucose, Bld: 101 mg/dL — ABNORMAL HIGH (ref 65–99)
Potassium: 3.9 mmol/L (ref 3.5–5.1)
SODIUM: 135 mmol/L (ref 135–145)

## 2017-07-01 LAB — CBC
HCT: 29 % — ABNORMAL LOW (ref 39.0–52.0)
Hemoglobin: 9.2 g/dL — ABNORMAL LOW (ref 13.0–17.0)
MCH: 29.8 pg (ref 26.0–34.0)
MCHC: 31.7 g/dL (ref 30.0–36.0)
MCV: 93.9 fL (ref 78.0–100.0)
PLATELETS: 801 10*3/uL — AB (ref 150–400)
RBC: 3.09 MIL/uL — ABNORMAL LOW (ref 4.22–5.81)
RDW: 14.9 % (ref 11.5–15.5)
WBC: 12.1 10*3/uL — AB (ref 4.0–10.5)
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
## 2017-07-01 NOTE — Progress Notes (Signed)
CT d/c'd per order and per protocol. Pt tolerated fair. Pt repositioned in bed. Call bell and phone within reach. Will continue to monitor.

## 2017-07-02 ENCOUNTER — Inpatient Hospital Stay (HOSPITAL_COMMUNITY): Payer: Medicaid Other

## 2017-07-02 LAB — BASIC METABOLIC PANEL
Anion gap: 9 (ref 5–15)
BUN: 12 mg/dL (ref 6–20)
CALCIUM: 9.1 mg/dL (ref 8.9–10.3)
CO2: 28 mmol/L (ref 22–32)
Chloride: 96 mmol/L — ABNORMAL LOW (ref 101–111)
Creatinine, Ser: 0.64 mg/dL (ref 0.61–1.24)
GLUCOSE: 117 mg/dL — AB (ref 65–99)
Potassium: 4 mmol/L (ref 3.5–5.1)
Sodium: 133 mmol/L — ABNORMAL LOW (ref 135–145)

## 2017-07-02 LAB — CBC
HCT: 31.3 % — ABNORMAL LOW (ref 39.0–52.0)
Hemoglobin: 9.9 g/dL — ABNORMAL LOW (ref 13.0–17.0)
MCH: 29.4 pg (ref 26.0–34.0)
MCHC: 31.6 g/dL (ref 30.0–36.0)
MCV: 92.9 fL (ref 78.0–100.0)
PLATELETS: 854 10*3/uL — AB (ref 150–400)
RBC: 3.37 MIL/uL — AB (ref 4.22–5.81)
RDW: 14.7 % (ref 11.5–15.5)
WBC: 11.9 10*3/uL — AB (ref 4.0–10.5)

## 2017-07-02 MED ORDER — ZOLPIDEM TARTRATE 5 MG PO TABS
5.0000 mg | ORAL_TABLET | Freq: Every evening | ORAL | Status: DC | PRN
  Administered 2017-07-02 – 2017-07-08 (×6): 5 mg via ORAL
  Filled 2017-07-02 (×7): qty 1

## 2017-07-02 MED ORDER — METHOCARBAMOL 500 MG PO TABS
500.0000 mg | ORAL_TABLET | Freq: Three times a day (TID) | ORAL | Status: DC
  Administered 2017-07-02 – 2017-07-04 (×7): 500 mg via ORAL
  Filled 2017-07-02 (×7): qty 1

## 2017-07-02 MED ORDER — OXYCODONE HCL 5 MG PO TABS
10.0000 mg | ORAL_TABLET | ORAL | Status: DC | PRN
  Administered 2017-07-02 – 2017-07-09 (×37): 20 mg via ORAL
  Filled 2017-07-02: qty 4
  Filled 2017-07-02: qty 2
  Filled 2017-07-02 (×24): qty 4
  Filled 2017-07-02: qty 2
  Filled 2017-07-02 (×13): qty 4

## 2017-07-02 MED ORDER — GABAPENTIN 600 MG PO TABS
300.0000 mg | ORAL_TABLET | Freq: Three times a day (TID) | ORAL | Status: DC
  Administered 2017-07-02 – 2017-07-04 (×7): 300 mg via ORAL
  Filled 2017-07-02 (×7): qty 1
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
## 2017-07-02 NOTE — OR Nursing (Signed)
LATE ENTRY: Corrected implant removed [id number] replaced with [phone] and removed [id number] replaced with [phone].  Verified correct implants with signed RN circulator request to purchase documentation.
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
## 2017-07-03 ENCOUNTER — Encounter (HOSPITAL_COMMUNITY): Payer: Self-pay | Admitting: Student

## 2017-07-03 ENCOUNTER — Inpatient Hospital Stay (HOSPITAL_COMMUNITY): Payer: Medicaid Other

## 2017-07-03 NOTE — Plan of Care (Signed)
Problem: Pain Managment: Goal: General experience of comfort will improve Outcome: Progressing Pt states pain regiment is working well with him, able to sleep longer periods of time. Able to move extremities better.  Problem: Activity: Goal: Mobility will improve Outcome: Progressing Pt able to move lower extremities much better than before. Able to reposition himself in bed.

## 2017-07-03 NOTE — Progress Notes (Signed)
Orthopedic Tech Progress Note Patient Details:  Mike [name] 01/18/1991 [id number]  Ortho Devices Type of Ortho Device: CAM walker Ortho Device/Splint Interventions: Ordered, Application   [name] 07/03/2017, 12:49 PM
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
## 2017-07-03 NOTE — Plan of Care (Signed)
Problem: Pain Managment: Goal: General experience of comfort will improve Outcome: Progressing Pt endorses feeling much better when able to sleep this pm. Able to wait longer between PRN pain medications.
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
## 2017-07-04 LAB — CBC
HCT: 32.2 % — ABNORMAL LOW (ref 39.0–52.0)
Hemoglobin: 10.2 g/dL — ABNORMAL LOW (ref 13.0–17.0)
MCH: 30 pg (ref 26.0–34.0)
MCHC: 31.7 g/dL (ref 30.0–36.0)
MCV: 94.7 fL (ref 78.0–100.0)
PLATELETS: 784 10*3/uL — AB (ref 150–400)
RBC: 3.4 MIL/uL — ABNORMAL LOW (ref 4.22–5.81)
RDW: 15.2 % (ref 11.5–15.5)
WBC: 11.3 10*3/uL — ABNORMAL HIGH (ref 4.0–10.5)

## 2017-07-04 LAB — BASIC METABOLIC PANEL
Anion gap: 8 (ref 5–15)
BUN: 12 mg/dL (ref 6–20)
CALCIUM: 9.2 mg/dL (ref 8.9–10.3)
CO2: 30 mmol/L (ref 22–32)
CREATININE: 0.73 mg/dL (ref 0.61–1.24)
Chloride: 98 mmol/L — ABNORMAL LOW (ref 101–111)
GFR calc Af Amer: >60 mL/min (ref >60)
GLUCOSE: 103 mg/dL — AB (ref 65–99)
Potassium: 4.4 mmol/L (ref 3.5–5.1)
Sodium: 136 mmol/L (ref 135–145)

## 2017-07-04 MED ORDER — GABAPENTIN 400 MG PO CAPS
400.0000 mg | ORAL_CAPSULE | Freq: Three times a day (TID) | ORAL | Status: DC
  Administered 2017-07-04 – 2017-07-05 (×3): 400 mg via ORAL
  Filled 2017-07-04 (×3): qty 1

## 2017-07-04 MED ORDER — MAGNESIUM CITRATE PO SOLN
1.0000 | Freq: Every day | ORAL | Status: DC | PRN
Start: 2017-07-04 — End: 2017-07-09
  Administered 2017-07-05: 1 via ORAL
  Filled 2017-07-04 (×2): qty 296

## 2017-07-04 MED ORDER — METHOCARBAMOL 750 MG PO TABS
750.0000 mg | ORAL_TABLET | Freq: Three times a day (TID) | ORAL | Status: DC
  Administered 2017-07-04 – 2017-07-09 (×15): 750 mg via ORAL
  Filled 2017-07-04 (×4): qty 1
  Filled 2017-07-04 (×2): qty 2
  Filled 2017-07-04 (×3): qty 1
  Filled 2017-07-04: qty 2
  Filled 2017-07-04 (×2): qty 1
  Filled 2017-07-04: qty 2
  Filled 2017-07-04: qty 1
  Filled 2017-07-04: qty 2

## 2017-07-04 MED ORDER — DOCUSATE SODIUM 100 MG PO CAPS
100.0000 mg | ORAL_CAPSULE | Freq: Two times a day (BID) | ORAL | Status: DC
  Administered 2017-07-04 – 2017-07-09 (×11): 100 mg via ORAL
  Filled 2017-07-04 (×11): qty 1

## 2017-07-04 MED ORDER — GABAPENTIN 800 MG PO TABS
400.0000 mg | ORAL_TABLET | Freq: Three times a day (TID) | ORAL | Status: DC
  Filled 2017-07-04: qty 0.5

## 2017-07-04 MED ORDER — MORPHINE SULFATE (PF) 4 MG/ML IV SOLN
4.0000 mg | Freq: Four times a day (QID) | INTRAVENOUS | Status: DC | PRN
  Administered 2017-07-04 – 2017-07-05 (×6): 4 mg via INTRAVENOUS
  Filled 2017-07-04 (×6): qty 1
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
## 2017-07-05 MED ORDER — MORPHINE SULFATE (PF) 2 MG/ML IV SOLN
2.0000 mg | Freq: Four times a day (QID) | INTRAVENOUS | Status: DC | PRN
  Administered 2017-07-05 (×2): 2 mg via INTRAVENOUS
  Filled 2017-07-05 (×2): qty 1

## 2017-07-05 MED ORDER — GABAPENTIN 400 MG PO CAPS
500.0000 mg | ORAL_CAPSULE | Freq: Three times a day (TID) | ORAL | Status: DC
  Administered 2017-07-05 – 2017-07-08 (×9): 500 mg via ORAL
  Filled 2017-07-05 (×9): qty 1

## 2017-07-05 MED ORDER — MORPHINE SULFATE (PF) 4 MG/ML IV SOLN
2.0000 mg | Freq: Four times a day (QID) | INTRAVENOUS | Status: DC | PRN
  Administered 2017-07-06 (×2): 2 mg via INTRAVENOUS
  Filled 2017-07-05 (×2): qty 1
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
## 2017-07-06 LAB — CBC
HCT: 30.2 % — ABNORMAL LOW (ref 39.0–52.0)
Hemoglobin: 9.6 g/dL — ABNORMAL LOW (ref 13.0–17.0)
MCH: 30.2 pg (ref 26.0–34.0)
MCHC: 31.8 g/dL (ref 30.0–36.0)
MCV: 95 fL (ref 78.0–100.0)
PLATELETS: 675 10*3/uL — AB (ref 150–400)
RBC: 3.18 MIL/uL — AB (ref 4.22–5.81)
RDW: 14.9 % (ref 11.5–15.5)
WBC: 10.5 10*3/uL (ref 4.0–10.5)

## 2017-07-06 MED ORDER — MORPHINE SULFATE (PF) 4 MG/ML IV SOLN
2.0000 mg | INTRAVENOUS | Status: DC | PRN
Start: 2017-07-06 — End: 2017-07-09
  Administered 2017-07-06 – 2017-07-09 (×17): 2 mg via INTRAVENOUS
  Filled 2017-07-06 (×17): qty 1
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
## 2017-07-06 NOTE — Progress Notes (Signed)
PT Cancellation Note  Patient Details Name: Mike [name] MRN: [id number] DOB: [birth year]   Cancelled Treatment:    Reason Eval/Treat Not Completed: (P) [name] (Pt reports intolerable pain after getting up to [facility].  PLan for tx tomorrow.  )   Florestine Avers 07/06/2017, 4:23 PM  Joycelyn Rua, PTA pager [phone]
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
## 2017-07-07 NOTE — Progress Notes (Addendum)
OT Cancellation    07/07/17 1600  OT Visit Information  Last OT Received On 07/07/17  Reason Eval/Treat Not Completed Fatigue/lethargy limiting ability to participate. Pt reporting that he did not sleep well and was too tired for OOB activity. Educated pt on benefits of OOB activity and mobility. Pt verbalizing understanding and states he would be willing to participate tomorrow when he isn't tired.    Kortni Hasten MSOT, OTR/L Acute Rehab Pager: [phone] Office: [phone]

## 2017-07-07 NOTE — Progress Notes (Signed)
PT Cancellation Note  Patient Details Name: Mike [name] MRN: [id number] DOB: 03-18-1991   Cancelled Treatment:    Reason Eval/Treat Not Completed: (P) Fatigue/lethargy limiting ability to participate (Pt reports he did not sleep well and he wishes to rest at this time.  PTA educated on the benefits of mobility and patient remains to decline at this time.  )   Florestine Avers 07/07/2017, 6:18 PM  Joycelyn Rua, PTA pager [phone]
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
## 2017-07-07 NOTE — Progress Notes (Signed)
Patient remains on DTP list. CSW faxed referral to [place], [place], and [place].  Mike [name] LCSWA [phone]
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
## 2017-07-07 NOTE — Progress Notes (Signed)
Doing well this PM when I saw him  RUE: Incision appears healthy without active drainage.AROM without pain, sutures were removed  RLE: Removed dressings today, no boot in place. Incisions clean, dry and intact. Sensation intact to all of foot. Passive DF to neutral, unable to actively DF to neutral  LLE: Splint in place, clean and dry. Wiggles toes and sensation intact to light touch to toes  Pelvis: Wounds appear healthy  Plan: 26 yo male with following orthopaedic injuries: 1. Type IIIA open left 2nd-5th metatarsal neck fractures s/p I&D and perc pinning 2. 1st left metatarsal shaft fracture-non-op for now 3. Left 1st TMT dislocation with middle and lateral cuneiform fractures s/p CRPP of 1st TMT 4. Dorsal talar head avulsion fracture and nondisplaced anterior process calcaneus fracture-Non-op 5. Left SER2 closed ankle fracture, with small medial mal and posterior mal fracture-Non-op 6. Type II open right ankle fracture s/p external fixator removal and ORIF 7. Traumatic posterior tibialis tendon rupture-S/p tenodesis 8. Type II right open trans-olecranon fracture dislocation s/p ORIF 9. Unstable pelvic ring injury with right zone 2 sacral fracture and left crescent iliac wing fracture s/p ORIF/CRPP 10. Unstable left knee with multiligamentous knee injury-Plan for delayed reconstruction  Please place patients right foot in boot. He is developing an equinus contracture. Will plan to take down left splint in next day or so.  Roby Lofts, MD [facility] [phone] (phone)

## 2017-07-08 LAB — CBC
HCT: 33.9 % — ABNORMAL LOW (ref 39.0–52.0)
Hemoglobin: 10.8 g/dL — ABNORMAL LOW (ref 13.0–17.0)
MCH: 30.2 pg (ref 26.0–34.0)
MCHC: 31.9 g/dL (ref 30.0–36.0)
MCV: 94.7 fL (ref 78.0–100.0)
PLATELETS: 578 10*3/uL — AB (ref 150–400)
RBC: 3.58 MIL/uL — AB (ref 4.22–5.81)
RDW: 14.7 % (ref 11.5–15.5)
WBC: 12.1 10*3/uL — ABNORMAL HIGH (ref 4.0–10.5)

## 2017-07-08 MED ORDER — GABAPENTIN 300 MG PO CAPS
600.0000 mg | ORAL_CAPSULE | Freq: Three times a day (TID) | ORAL | Status: DC
  Administered 2017-07-08 – 2017-07-09 (×3): 600 mg via ORAL
  Filled 2017-07-08 (×3): qty 2
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
## 2017-07-08 NOTE — Clinical Social Work Note (Signed)
Clinical Social Worker continuing to follow patient and family for support and discharge planning needs.  Patient has a bed at [facility].  Patient and girlfriend informed at bedside and will update the rest of the family.  CSW to arrange ambulance transportation for 1:00pm tomorrow.  MD updated.  CSW remains available for support and to facilitate patient discharge needs once medically stable.  Macario Golds, [place] [phone]

## 2017-07-08 NOTE — Progress Notes (Signed)
OT Cancellation Note  Patient Details Name: Mike [name] MRN: [id number] DOB: 1991-05-07   Cancelled Treatment:     Reason pt not seen/treatment not performed: Pt declined OT treatment this morning stating that he was trying to rest. Pt reports difficulty getting pain relief. Reviewed OT goals with pt and his girlfriend today both of whom state that pt is able to bathe himself (except his back and buttocks) and that he has already done his LUE ex's as issued previously. Pt states that he will consider working with acute OT after he is better rested.  Charletta Cousin Kiosha Buchan Beth Dixon , OTR/L 07/08/2017, 8:34 AM

## 2017-07-09 MED ORDER — DOCUSATE SODIUM 100 MG PO CAPS: 100.0000 mg | ORAL_CAPSULE | Freq: Two times a day (BID) | ORAL | 0 refills | Status: DC

## 2017-07-09 MED ORDER — ZOLPIDEM TARTRATE 5 MG PO TABS: 5.0000 mg | ORAL_TABLET | Freq: Every evening | ORAL | 0 refills | Status: DC | PRN

## 2017-07-09 MED ORDER — ENOXAPARIN SODIUM 40 MG/0.4ML ~~LOC~~ SOLN: 40.0000 mg | Freq: Every day | SUBCUTANEOUS | 0 refills | Status: DC

## 2017-07-09 MED ORDER — POLYETHYLENE GLYCOL 3350 17 G PO PACK: 17.0000 g | PACK | Freq: Two times a day (BID) | ORAL | 0 refills | Status: DC

## 2017-07-09 MED ORDER — GABAPENTIN 300 MG PO CAPS: 600.0000 mg | ORAL_CAPSULE | Freq: Three times a day (TID) | ORAL | 0 refills | Status: DC

## 2017-07-09 MED ORDER — TRAMADOL HCL 50 MG PO TABS: 100.0000 mg | ORAL_TABLET | Freq: Four times a day (QID) | ORAL | 1 refills | Status: DC | PRN

## 2017-07-09 MED ORDER — METOPROLOL TARTRATE 25 MG PO TABS: 12.5000 mg | ORAL_TABLET | Freq: Two times a day (BID) | ORAL | 0 refills | Status: DC

## 2017-07-09 MED ORDER — DIPHENHYDRAMINE HCL 12.5 MG/5ML PO ELIX: 12.5000 mg | ORAL_SOLUTION | Freq: Four times a day (QID) | ORAL | 0 refills | Status: DC | PRN

## 2017-07-09 MED ORDER — OXYCODONE HCL 10 MG PO TABS: 10.0000 mg | ORAL_TABLET | ORAL | 0 refills | Status: DC | PRN

## 2017-07-09 MED ORDER — METHOCARBAMOL 750 MG PO TABS: 750.0000 mg | ORAL_TABLET | Freq: Three times a day (TID) | ORAL | 0 refills | Status: DC

## 2017-07-09 MED ORDER — ENSURE ENLIVE PO LIQD: 237.0000 mL | Freq: Three times a day (TID) | ORAL | 12 refills | Status: DC

## 2017-07-09 MED ORDER — PANTOPRAZOLE SODIUM 40 MG PO TBEC: 40.0000 mg | DELAYED_RELEASE_TABLET | Freq: Every day | ORAL | 0 refills | Status: DC

## 2017-07-09 MED ORDER — BISACODYL 10 MG RE SUPP: 10.0000 mg | Freq: Every day | RECTAL | 0 refills | Status: DC | PRN

## 2017-07-09 NOTE — Discharge Instructions (Signed)
Orthopaedic Trauma Service Discharge Instructions   General Discharge Instructions  WEIGHT BEARING STATUS: Nonweighbearing Bilateral lower extremities. Nonweightbearing right upper extremity   RANGE OF MOTION/ACTIVITY:    Unrestricted Range of motion Bilateral lower extremities including ankles    Gentle PROM and AROM R elbow. No elbow extension against resistance      Range of motion exercises 3x a day at a minimum      Boots must be on B lower extremities when not working on Range of motion to prevent equinus contractures    Ok to remove boots for hygiene, dressing changes and therapy   Wound Care:  -Dressings to RLE and LLE with adaptic around pin sites and on incisions and wrapped with kerlix to be changed every other day at the facility. Can use ace wraps or compression socks to help with swelling   Diet: as you were eating previously.  Can use over the counter stool softeners and bowel preparations, such as Miralax, to help with bowel movements.  Narcotics can be constipating.  Be sure to drink plenty of fluids  PAIN MEDICATION USE AND EXPECTATIONS  You have likely been given narcotic medications to help control your pain.  After a traumatic event that results in an fracture (broken bone) with or without surgery, it is ok to use narcotic pain medications to help control one's pain.  We understand that everyone responds to pain differently and each individual patient will be evaluated on a regular basis for the continued need for narcotic medications. Ideally, narcotic medication use should last no more than 6-8 weeks (coinciding with fracture healing).   As a patient it is your responsibility as well to monitor narcotic medication use and report the amount and frequency you use these medications when you come to your office visit.   We would also advise that if you are using narcotic medications, you should take a dose prior to therapy to maximize you participation.  IF YOU ARE ON  NARCOTIC MEDICATIONS IT IS NOT PERMISSIBLE TO OPERATE A MOTOR VEHICLE (MOTORCYCLE/CAR/TRUCK/MOPED) OR HEAVY MACHINERY DO NOT MIX NARCOTICS WITH OTHER CNS (CENTRAL NERVOUS SYSTEM) DEPRESSANTS SUCH AS ALCOHOL   STOP SMOKING OR USING NICOTINE PRODUCTS!!!!  As discussed nicotine severely impairs your body's ability to heal surgical and traumatic wounds but also impairs bone healing.  Wounds and bone heal by forming microscopic blood vessels (angiogenesis) and nicotine is a vasoconstrictor (essentially, shrinks blood vessels).  Therefore, if vasoconstriction occurs to these microscopic blood vessels they essentially disappear and are unable to deliver necessary nutrients to the healing tissue.  This is one modifiable factor that you can do to dramatically increase your chances of healing your injury.    (This means no smoking, no nicotine gum, patches, etc)  DO NOT USE NONSTEROIDAL ANTI-INFLAMMATORY DRUGS (NSAID'S)  Using products such as Advil (ibuprofen), Aleve (naproxen), Motrin (ibuprofen) for additional pain control during fracture healing can delay and/or prevent the healing response.  If you would like to take over the counter (OTC) medication, Tylenol (acetaminophen) is ok.  However, some narcotic medications that are given for pain control contain acetaminophen as well. Therefore, you should not exceed more than 4000 mg of tylenol in a day if you do not have liver disease.  Also note that there are may OTC medicines, such as cold medicines and allergy medicines that my contain tylenol as well.  If you have any questions about medications and/or interactions please ask your doctor/PA or your pharmacist.  ICE AND ELEVATE INJURED/OPERATIVE EXTREMITY  Using ice and elevating the injured extremity above your heart can help with swelling and pain control.  Icing in a pulsatile fashion, such as 20 minutes on and 20 minutes off, can be followed.    Do not place ice directly on skin. Make sure there is  a barrier between to skin and the ice pack.    Using frozen items such as frozen peas works well as the conform nicely to the are that needs to be iced.  USE AN ACE WRAP OR TED HOSE FOR SWELLING CONTROL  In addition to icing and elevation, Ace wraps or TED hose are used to help limit and resolve swelling.  It is recommended to use Ace wraps or TED hose until you are informed to stop.    When using Ace Wraps start the wrapping distally (farthest away from the body) and wrap proximally (closer to the body)   Example: If you had surgery on your leg or thing and you do not have a splint on, start the ace wrap at the toes and work your way up to the thigh        If you had surgery on your upper extremity and do not have a splint on, start the ace wrap at your fingers and work your way up to the upper arm  IF YOU ARE IN A SPLINT OR CAST DO NOT REMOVE IT FOR ANY REASON   If your splint gets wet for any reason please contact the office immediately. You may shower in your splint or cast as long as you keep it dry.  This can be done by wrapping in a cast cover or garbage back (or similar)  Do Not stick any thing down your splint or cast such as pencils, money, or hangers to try and scratch yourself with.  If you feel itchy take benadryl as prescribed on the bottle for itching  IF YOU ARE IN A CAM BOOT (BLACK BOOT)  You may remove boot periodically. Perform daily dressing changes as noted below.  Wash the liner of the boot regularly and wear a sock when wearing the boot. It is recommended that you sleep in the boot until told otherwise  CALL THE OFFICE WITH ANY QUESTIONS OR CONCERNS: [phone]  Follow up with Dr. Jena Gauss on 07/21/2017, call for time       1. PAIN CONTROL:  1. Pain is best controlled by a usual combination of three different methods TOGETHER:  1. Ice/Heat 2. Over the counter pain medication 3. Prescription pain medication 2. Most patients will experience some swelling and bruising  around wounds. Ice packs or heating pads (30-60 minutes up to 6 times a day) will help. Use ice for the first few days to help decrease swelling and bruising, then switch to heat to help relax tight/sore spots and speed recovery. Some people prefer to use ice alone, heat alone, alternating between ice & heat. Experiment to what works for you. Swelling and bruising can take several weeks to resolve.  3. A prescription for pain medication (such as oxycodone, hydrocodone, etc) should be given to you upon discharge. Take your pain medication as prescribed.  1. If you are having problems/concerns with the prescription medicine (does not control pain, nausea, vomiting, rash, itching, etc), please call us [phone] to see if we need to switch you to a different pain medicine that will work better for you and/or control your side effect better. 2. If you  need a refill on your pain medication, please contact your pharmacy. They will contact our office to request authorization. Prescriptions will not be filled after 5 pm or on week-ends. 4. Avoid getting constipated. When taking pain medications, it is common to experience some constipation. Increasing fluid intake and taking a fiber supplement (such as Metamucil, Citrucel, FiberCon, MiraLax, etc) 1-2 times a day regularly will usually help prevent this problem from occurring. A mild laxative (prune juice, Milk of Magnesia, MiraLax, etc) should be taken according to package directions if there are no bowel movements after 48 hours.  5. Watch out for diarrhea. If you have many loose bowel movements, simplify your diet to bland foods & liquids for a few days. Stop any stool softeners and decrease your fiber supplement. Switching to mild anti-diarrheal medications (Kayopectate, Pepto Bismol) can help. If this worsens or does not improve, please call us. 6. FOLLOW UP in our office  1. Please call [facility] at [phone] to set up an appointment for a follow-up  appointment approximately 2 weeks after discharge for wound check  WHEN TO CALL us [phone]:  1. Poor pain control 2. Reactions / problems with new medications (rash/itching, nausea, etc)  3. Fever over 101.5 F (38.5 C) 4. Worsening swelling or bruising 5. Continued bleeding from wounds. 6. Increased pain, redness, or drainage from the wounds which could be signs of infection  The clinic staff is available to answer your questions during regular business hours (8:30am-5pm). Please dont hesitate to call and ask to speak to one of our nurses for clinical concerns.  If you have a medical emergency, go to the nearest emergency room or call 911.  A surgeon from [facility] is always on call at the [facility], [place]  [address], [address], [place], [place] [address] ?  MAIN: [phone] ? TOLL FREE: [phone] ?  FAX [phone]  [email or web address]

## 2017-07-09 NOTE — Progress Notes (Signed)
Orthopedic Tech Progress Note Patient Details:  Mike [name] 19-Jun-1991 [id number]  Ortho Devices Type of Ortho Device: CAM walker Ortho Device/Splint Location: lle Ortho Device/Splint Interventions: Application   Hiliana Eilts 07/09/2017, 10:27 AM

## 2017-07-09 NOTE — Clinical Social Work Note (Signed)
Clinical Social Worker facilitated patient discharge including contacting patient and facility to confirm patient discharge plans.  Clinical information faxed to facility and patient agreeable with plan.  CSW arranged ambulance transport via [facility] to [facility].  RN to call report prior to discharge.  Clinical Social Worker will sign off for now as social work intervention is no longer needed. Please consult [place] again if new need arises.  Macario Golds, [place] [phone]

## 2017-07-09 NOTE — Progress Notes (Signed)
Doing well, no major issues  RUE: Incision appears healthy without active drainage.AROM without pain, sutures were removed  RLE: Placed in boot. Incisions clean, dry and intact. Sensation intact to all of foot. Passive DF to neutral, unable to actively DF to neutral  LLE: Removed splint, wound appear healthy, minimal active dorsiflexion, able to passively dorsiflex to neutral and 10 deg past  Pelvis: Wounds appear healthy  Plan: 26 yo male with following orthopaedic injuries: 1. Type IIIA open left 2nd-5th metatarsal neck fractures s/p I&D and perc pinning 2. 1st left metatarsal shaft fracture-non-op for now 3. Left 1st TMT dislocation with middle and lateral cuneiform fractures s/p CRPP of 1st TMT 4. Dorsal talar head avulsion fracture and nondisplaced anterior process calcaneus fracture-Non-op 5. Left SER2 closed ankle fracture, with small medial mal and posterior mal fracture-Non-op 6. Type II open right ankle fracture s/p external fixator removal and ORIF 7. Traumatic posterior tibialis tendon rupture-S/p tenodesis 8. Type II right open trans-olecranon fracture dislocation s/p ORIF 9. Unstable pelvic ring injury with right zone 2 sacral fracture and left crescent iliac wing fracture s/p ORIF/CRPP 10. Unstable left knee with multiligamentous knee injury-Plan for delayed reconstruction  -Recommend NWB BLE/RUE -Bilateral boots to were majority of the day to prevent equinus contractures -ROM of bilateral ankles at least three times a day -Dressings to RLE and LLE with adaptic around pin sites and on incisions and wrapped with kerlix to be changed every other day at the facility. -Follow up with me in 10-14 days for x-rays and suture removal -May call [phone] to schedule this appointment, likely October 2nd or 9th. -Please call with any questions.  Roby Lofts, MD [facility] [phone] (phone)
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
## 2017-07-09 NOTE — Clinical Social Work Placement (Signed)
   CLINICAL SOCIAL WORK PLACEMENT  NOTE  Date:  07/09/2017  Patient Details  Name: DYAMI UMBACH MRN: [id number] Date of Birth: [birth year]  Clinical Social Work is seeking post-discharge placement for this patient at the Skilled  Nursing Facility level of care (*CSW will initial, date and re-position this form in  chart as items are completed):  Yes   Patient/family provided with [facility] Clinical Social Work Department's list of facilities offering this level of care within the geographic area requested by the patient (or if unable, by the patient's family).  Yes   Patient/family informed of their freedom to choose among providers that offer the needed level of care, that participate in Medicare, Medicaid or managed care program needed by the patient, have an available bed and are willing to accept the patient.  Yes   Patient/family informed of [facility]'s ownership interest in [facility] and [facility], as well as of the fact that they are under no obligation to receive care at these facilities.  PASRR submitted to EDS on 07/06/17     PASRR number received on 07/06/17     Existing PASRR number confirmed on       FL2 transmitted to all facilities in geographic area requested by pt/family on 07/06/17     FL2 transmitted to all facilities within larger geographic area on 07/06/17     Patient informed that his/her managed care company has contracts with or will negotiate with certain facilities, including the following:        Yes   Patient/family informed of bed offers received.  Patient chooses bed at Other - please specify in the comment section below: [facility])     Physician recommends and patient chooses bed at      Patient to be transferred to Other - please specify in the comment section below: [facility]) on 07/09/17.  Patient to be transferred to facility by Ambulance     Patient family notified on 07/09/17 of  transfer.  Name of family member notified:  Patient to notify father and finace to ride in ambulance     PHYSICIAN       Additional Comment:   Macario Golds, LCSW [phone]

## 2017-07-09 NOTE — Progress Notes (Signed)
Pt discharged to SNF in stable condition, all discharge instructions reviewed with pt and significant other, Rx's x 4 given to transporters with paperwork.  Pt transported via cart with transporters x 2.  AKingRNBSN

## 2017-08-21 ENCOUNTER — Emergency Department (HOSPITAL_COMMUNITY): Payer: Medicaid Other

## 2017-08-21 ENCOUNTER — Emergency Department (HOSPITAL_COMMUNITY)
Admission: EM | Admit: 2017-08-21 | Discharge: 2017-08-21 | Disposition: A | Discharge status: Home / Self Care | Payer: Medicaid Other | Attending: Emergency Medicine | Admitting: Emergency Medicine

## 2017-08-21 ENCOUNTER — Encounter (HOSPITAL_COMMUNITY): Payer: Self-pay

## 2017-08-21 DIAGNOSIS — Z79899 Other long term (current) drug therapy: Secondary | ICD-10-CM | POA: Diagnosis not present

## 2017-08-21 DIAGNOSIS — F17210 Nicotine dependence, cigarettes, uncomplicated: Secondary | ICD-10-CM | POA: Diagnosis not present

## 2017-08-21 DIAGNOSIS — Y9389 Activity, other specified: Secondary | ICD-10-CM | POA: Insufficient documentation

## 2017-08-21 DIAGNOSIS — Y929 Unspecified place or not applicable: Secondary | ICD-10-CM | POA: Diagnosis not present

## 2017-08-21 DIAGNOSIS — M25571 Pain in right ankle and joints of right foot: Secondary | ICD-10-CM | POA: Diagnosis present

## 2017-08-21 DIAGNOSIS — M25552 Pain in left hip: Secondary | ICD-10-CM

## 2017-08-21 DIAGNOSIS — W010XXA Fall on same level from slipping, tripping and stumbling without subsequent striking against object, initial encounter: Secondary | ICD-10-CM | POA: Insufficient documentation

## 2017-08-21 MED ORDER — OXYCODONE-ACETAMINOPHEN 5-325 MG PO TABS
ORAL_TABLET | ORAL | Status: AC
  Filled 2017-08-21: qty 1

## 2017-08-21 MED ORDER — OXYCODONE-ACETAMINOPHEN 5-325 MG PO TABS
2.0000 | ORAL_TABLET | Freq: Once | ORAL | Status: AC
  Administered 2017-08-21: 2 via ORAL
  Filled 2017-08-21: qty 2

## 2017-08-21 MED ORDER — OXYCODONE-ACETAMINOPHEN 5-325 MG PO TABS
1.0000 | ORAL_TABLET | Freq: Once | ORAL | Status: AC
  Administered 2017-08-21: 1 via ORAL

## 2017-08-21 NOTE — Discharge Instructions (Signed)
It was our pleasure to provide your ER care today - we hope that you feel better.  Your xrays are negative for acute fracture.   Take acetaminophen and/or ibuprofen as need for pain.  Follow up with your orthopedist as planned.  You were given pain medication while in the ER - no driving for the next 6 hours.
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
## 2017-08-24 ENCOUNTER — Emergency Department (HOSPITAL_COMMUNITY)
Admission: EM | Admit: 2017-08-24 | Discharge: 2017-08-24 | Disposition: A | Discharge status: Home / Self Care | Payer: Medicaid Other | Attending: Emergency Medicine | Admitting: Emergency Medicine

## 2017-08-24 ENCOUNTER — Encounter (HOSPITAL_COMMUNITY): Payer: Self-pay | Admitting: Emergency Medicine

## 2017-08-24 ENCOUNTER — Other Ambulatory Visit: Payer: Self-pay

## 2017-08-24 DIAGNOSIS — M5442 Lumbago with sciatica, left side: Secondary | ICD-10-CM | POA: Diagnosis not present

## 2017-08-24 DIAGNOSIS — Z79899 Other long term (current) drug therapy: Secondary | ICD-10-CM | POA: Diagnosis not present

## 2017-08-24 DIAGNOSIS — M545 Low back pain: Secondary | ICD-10-CM | POA: Diagnosis present

## 2017-08-24 DIAGNOSIS — F17210 Nicotine dependence, cigarettes, uncomplicated: Secondary | ICD-10-CM | POA: Insufficient documentation

## 2017-08-24 MED ORDER — CYCLOBENZAPRINE HCL 10 MG PO TABS: 10.0000 mg | ORAL_TABLET | Freq: Three times a day (TID) | ORAL | 0 refills | Status: DC | PRN

## 2017-08-24 MED ORDER — OXYCODONE-ACETAMINOPHEN 5-325 MG PO TABS
2.0000 | ORAL_TABLET | Freq: Once | ORAL | Status: AC
  Administered 2017-08-24: 2 via ORAL
  Filled 2017-08-24: qty 2
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
## 2017-08-24 NOTE — Discharge Instructions (Signed)
Workup has been normal. Please take medications as prescribed and instructed.  Please the the flexeril for muscle relaxation. This medication will make you drowsy so avoid situation that could place you in danger.   Make sure you dicussed your back pain with your orthopaedic doctor tomorrow.    SEEK IMMEDIATE MEDICAL ATTENTION IF: New numbness, tingling, weakness, or problem with the use of your arms or legs.  Severe back pain not relieved with medications.  Change in bowel or bladder control.  Urinary retention.  Numbness in your groin.  Increasing pain in any areas of the body (such as chest or abdominal pain).  Shortness of breath, dizziness or fainting.  Nausea (feeling sick to your stomach), vomiting, fever, or sweats.

## 2017-08-31 ENCOUNTER — Ambulatory Visit: Payer: No Typology Code available for payment source | Admitting: Physical Therapy

## 2017-09-03 ENCOUNTER — Ambulatory Visit: Payer: Medicaid Other | Attending: Student | Admitting: Physical Therapy

## 2017-09-03 ENCOUNTER — Encounter: Payer: Self-pay | Admitting: Physical Therapy

## 2017-09-03 DIAGNOSIS — M25571 Pain in right ankle and joints of right foot: Secondary | ICD-10-CM | POA: Diagnosis present

## 2017-09-03 DIAGNOSIS — M25572 Pain in left ankle and joints of left foot: Secondary | ICD-10-CM

## 2017-09-03 DIAGNOSIS — M25672 Stiffness of left ankle, not elsewhere classified: Secondary | ICD-10-CM | POA: Diagnosis present

## 2017-09-03 DIAGNOSIS — R262 Difficulty in walking, not elsewhere classified: Secondary | ICD-10-CM | POA: Diagnosis present

## 2017-09-03 DIAGNOSIS — M25671 Stiffness of right ankle, not elsewhere classified: Secondary | ICD-10-CM | POA: Diagnosis present
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
## 2017-09-17 ENCOUNTER — Encounter (HOSPITAL_COMMUNITY): Payer: Self-pay | Admitting: Emergency Medicine

## 2017-09-17 ENCOUNTER — Emergency Department (HOSPITAL_COMMUNITY)
Admission: EM | Admit: 2017-09-17 | Discharge: 2017-09-18 | Disposition: A | Discharge status: Home / Self Care | Payer: Medicaid Other | Attending: Emergency Medicine | Admitting: Emergency Medicine

## 2017-09-17 ENCOUNTER — Other Ambulatory Visit: Payer: Self-pay

## 2017-09-17 ENCOUNTER — Emergency Department (HOSPITAL_COMMUNITY): Payer: Medicaid Other

## 2017-09-17 DIAGNOSIS — M25552 Pain in left hip: Secondary | ICD-10-CM

## 2017-09-17 DIAGNOSIS — T405X4A Poisoning by cocaine, undetermined, initial encounter: Secondary | ICD-10-CM | POA: Insufficient documentation

## 2017-09-17 DIAGNOSIS — T50904A Poisoning by unspecified drugs, medicaments and biological substances, undetermined, initial encounter: Secondary | ICD-10-CM

## 2017-09-17 DIAGNOSIS — F17210 Nicotine dependence, cigarettes, uncomplicated: Secondary | ICD-10-CM | POA: Insufficient documentation

## 2017-09-17 DIAGNOSIS — T402X4A Poisoning by other opioids, undetermined, initial encounter: Secondary | ICD-10-CM | POA: Insufficient documentation

## 2017-09-17 LAB — CBC WITH DIFFERENTIAL/PLATELET
BASOS ABS: 0 10*3/uL (ref 0.0–0.1)
Basophils Relative: 0 %
EOS ABS: 0.1 10*3/uL (ref 0.0–0.7)
EOS PCT: 1 %
HCT: 40.9 % (ref 39.0–52.0)
Hemoglobin: 13.6 g/dL (ref 13.0–17.0)
LYMPHS PCT: 28 %
Lymphs Abs: 2.9 10*3/uL (ref 0.7–4.0)
MCH: 30.2 pg (ref 26.0–34.0)
MCHC: 33.3 g/dL (ref 30.0–36.0)
MCV: 90.7 fL (ref 78.0–100.0)
MONO ABS: 0.8 10*3/uL (ref 0.1–1.0)
Monocytes Relative: 8 %
Neutro Abs: 6.2 10*3/uL (ref 1.7–7.7)
Neutrophils Relative %: 63 %
PLATELETS: 315 10*3/uL (ref 150–400)
RBC: 4.51 MIL/uL (ref 4.22–5.81)
RDW: 13.8 % (ref 11.5–15.5)
WBC: 10.1 10*3/uL (ref 4.0–10.5)
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
## 2017-09-17 NOTE — ED Notes (Signed)
Pt reports to this RN that he may have taken 2 percocet at one time but also has not had any sleep the night before.

## 2017-09-18 LAB — RAPID URINE DRUG SCREEN, HOSP PERFORMED
Amphetamines: NOT DETECTED
Barbiturates: NOT DETECTED
Benzodiazepines: NOT DETECTED
COCAINE: POSITIVE — AB
OPIATES: POSITIVE — AB
TETRAHYDROCANNABINOL: NOT DETECTED

## 2017-09-18 LAB — COMPREHENSIVE METABOLIC PANEL
ALK PHOS: 139 U/L — AB (ref 38–126)
ALT: 16 U/L — AB (ref 17–63)
ANION GAP: 6 (ref 5–15)
AST: 21 U/L (ref 15–41)
Albumin: 3.7 g/dL (ref 3.5–5.0)
BILIRUBIN TOTAL: 0.6 mg/dL (ref 0.3–1.2)
BUN: 11 mg/dL (ref 6–20)
CALCIUM: 8.9 mg/dL (ref 8.9–10.3)
CO2: 27 mmol/L (ref 22–32)
CREATININE: 0.82 mg/dL (ref 0.61–1.24)
Chloride: 108 mmol/L (ref 101–111)
Glucose, Bld: 99 mg/dL (ref 65–99)
Potassium: 3.9 mmol/L (ref 3.5–5.1)
Sodium: 141 mmol/L (ref 135–145)
TOTAL PROTEIN: 7.1 g/dL (ref 6.5–8.1)

## 2017-09-18 LAB — ETHANOL: Alcohol, Ethyl (B): <10 mg/dL (ref <10)

## 2017-09-18 LAB — SALICYLATE LEVEL

## 2017-09-18 LAB — ACETAMINOPHEN LEVEL: Acetaminophen (Tylenol), Serum: <10 ug/mL — ABNORMAL LOW (ref 10–30)

## 2017-09-18 MED ORDER — NICOTINE 21 MG/24HR TD PT24
21.0000 mg | MEDICATED_PATCH | Freq: Once | TRANSDERMAL | Status: DC
Start: 2017-09-18 — End: 2017-09-18
  Administered 2017-09-18: 21 mg via TRANSDERMAL
  Filled 2017-09-18: qty 1

## 2017-09-18 MED ORDER — NALOXONE HCL 0.4 MG/ML IJ SOLN
0.4000 mg | Freq: Once | INTRAMUSCULAR | Status: AC
  Administered 2017-09-18: 0.4 mg via INTRAVENOUS
  Filled 2017-09-18: qty 1

## 2017-09-18 NOTE — Discharge Instructions (Signed)
You need to be careful with your pain medication-- take ONLY as directed. Follow-up with your primary care doctor if any ongoing issues. Return here for any new/worsening symptoms.

## 2017-10-07 ENCOUNTER — Other Ambulatory Visit (HOSPITAL_COMMUNITY): Payer: Self-pay | Admitting: Orthopaedic Surgery

## 2017-10-07 ENCOUNTER — Ambulatory Visit (HOSPITAL_COMMUNITY): Admission: RE | Admit: 2017-10-07 | Payer: Medicaid Other | Source: Ambulatory Visit

## 2017-10-07 DIAGNOSIS — M7989 Other specified soft tissue disorders: Principal | ICD-10-CM

## 2017-10-07 DIAGNOSIS — M79605 Pain in left leg: Secondary | ICD-10-CM

## 2017-10-08 ENCOUNTER — Ambulatory Visit (HOSPITAL_COMMUNITY)
Admission: RE | Admit: 2017-10-08 | Discharge: 2017-10-08 | Disposition: A | Discharge status: Home / Self Care | Payer: Medicaid Other | Source: Ambulatory Visit | Attending: Orthopaedic Surgery | Admitting: Orthopaedic Surgery

## 2017-10-09 ENCOUNTER — Ambulatory Visit (HOSPITAL_COMMUNITY)
Admission: RE | Admit: 2017-10-09 | Discharge: 2017-10-09 | Disposition: A | Discharge status: Home / Self Care | Payer: Medicaid Other | Source: Ambulatory Visit | Attending: Orthopaedic Surgery | Admitting: Orthopaedic Surgery

## 2017-10-09 DIAGNOSIS — M79605 Pain in left leg: Secondary | ICD-10-CM | POA: Diagnosis present

## 2017-10-09 DIAGNOSIS — M7989 Other specified soft tissue disorders: Secondary | ICD-10-CM | POA: Diagnosis not present

## 2017-10-09 NOTE — Progress Notes (Signed)
Left lower extremity venous duplex completed. No evidence of DVT, superficial thrombosis, or Baker's cyst. Mike DeitersVirginia Moua [name], RVS 10/09/2017 10:34 AM

## 2017-10-14 ENCOUNTER — Other Ambulatory Visit: Payer: Self-pay

## 2017-10-14 ENCOUNTER — Encounter (HOSPITAL_BASED_OUTPATIENT_CLINIC_OR_DEPARTMENT_OTHER): Payer: Self-pay | Admitting: *Deleted

## 2017-10-19 ENCOUNTER — Ambulatory Visit
Admission: RE | Admit: 2017-10-19 | Discharge: 2017-10-19 | Disposition: A | Discharge status: Home / Self Care | Payer: Medicaid Other | Source: Ambulatory Visit | Attending: Orthopaedic Surgery | Admitting: Orthopaedic Surgery

## 2017-10-19 ENCOUNTER — Other Ambulatory Visit: Payer: Self-pay | Admitting: Orthopaedic Surgery

## 2017-10-19 DIAGNOSIS — M25562 Pain in left knee: Secondary | ICD-10-CM

## 2017-10-19 MED ORDER — IOPAMIDOL (ISOVUE-370) INJECTION 76%
100.0000 mL | Freq: Once | INTRAVENOUS | Status: AC | PRN
  Administered 2017-10-19: 125 mL via INTRAVENOUS
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
## 2017-10-22 ENCOUNTER — Ambulatory Visit (HOSPITAL_BASED_OUTPATIENT_CLINIC_OR_DEPARTMENT_OTHER): Payer: Medicaid Other | Admitting: Anesthesiology

## 2017-10-22 ENCOUNTER — Encounter (HOSPITAL_BASED_OUTPATIENT_CLINIC_OR_DEPARTMENT_OTHER)
Admission: RE | Disposition: A | Discharge status: Home / Self Care | Payer: Self-pay | Source: Ambulatory Visit | Attending: Orthopaedic Surgery

## 2017-10-22 ENCOUNTER — Encounter (HOSPITAL_BASED_OUTPATIENT_CLINIC_OR_DEPARTMENT_OTHER): Payer: Self-pay | Admitting: *Deleted

## 2017-10-22 ENCOUNTER — Other Ambulatory Visit: Payer: Self-pay

## 2017-10-22 ENCOUNTER — Ambulatory Visit (HOSPITAL_BASED_OUTPATIENT_CLINIC_OR_DEPARTMENT_OTHER)
Admission: RE | Admit: 2017-10-22 | Discharge: 2017-10-23 | Disposition: A | Discharge status: Home / Self Care | Payer: Medicaid Other | Source: Ambulatory Visit | Attending: Orthopaedic Surgery | Admitting: Orthopaedic Surgery

## 2017-10-22 DIAGNOSIS — F329 Major depressive disorder, single episode, unspecified: Secondary | ICD-10-CM | POA: Insufficient documentation

## 2017-10-22 DIAGNOSIS — F17210 Nicotine dependence, cigarettes, uncomplicated: Secondary | ICD-10-CM | POA: Diagnosis not present

## 2017-10-22 DIAGNOSIS — M238X2 Other internal derangements of left knee: Secondary | ICD-10-CM | POA: Diagnosis present

## 2017-10-22 DIAGNOSIS — Y92410 Unspecified street and highway as the place of occurrence of the external cause: Secondary | ICD-10-CM | POA: Diagnosis not present

## 2017-10-22 DIAGNOSIS — S83522A Sprain of posterior cruciate ligament of left knee, initial encounter: Secondary | ICD-10-CM | POA: Diagnosis present

## 2017-10-22 DIAGNOSIS — S83212A Bucket-handle tear of medial meniscus, current injury, left knee, initial encounter: Secondary | ICD-10-CM | POA: Diagnosis not present

## 2017-10-22 DIAGNOSIS — Z79899 Other long term (current) drug therapy: Secondary | ICD-10-CM | POA: Insufficient documentation

## 2017-10-22 DIAGNOSIS — F419 Anxiety disorder, unspecified: Secondary | ICD-10-CM | POA: Insufficient documentation

## 2017-10-22 HISTORY — PX: KNEE ARTHROSCOPY WITH POSTERIOR CRUCIATE LIGAMENT (PCL) RECONSTRUCTION: SHX5657

## 2017-10-22 HISTORY — DX: Depression, unspecified: F32.A

## 2017-10-22 HISTORY — PX: KNEE ARTHROSCOPY WITH MEDIAL MENISECTOMY: SHX5651

## 2017-10-22 HISTORY — DX: Major depressive disorder, single episode, unspecified: F32.9

## 2017-10-22 HISTORY — DX: Anxiety disorder, unspecified: F41.9

## 2017-10-22 SURGERY — ARTHROSCOPY, KNEE, WITH MEDIAL MENISCECTOMY
Anesthesia: General | Site: Knee | Laterality: Left

## 2017-10-22 MED ORDER — VANCOMYCIN HCL 1000 MG IV SOLR
INTRAVENOUS | Status: AC
  Filled 2017-10-22: qty 1000

## 2017-10-22 MED ORDER — LIDOCAINE 2% (20 MG/ML) 5 ML SYRINGE
INTRAMUSCULAR | Status: AC
  Filled 2017-10-22: qty 5

## 2017-10-22 MED ORDER — PHENYLEPHRINE 40 MCG/ML (10ML) SYRINGE FOR IV PUSH (FOR BLOOD PRESSURE SUPPORT)
PREFILLED_SYRINGE | INTRAVENOUS | Status: AC
  Filled 2017-10-22: qty 10

## 2017-10-22 MED ORDER — DEXAMETHASONE SODIUM PHOSPHATE 10 MG/ML IJ SOLN
INTRAMUSCULAR | Status: AC
  Filled 2017-10-22: qty 1

## 2017-10-22 MED ORDER — ACETAMINOPHEN 10 MG/ML IV SOLN
INTRAVENOUS | Status: AC
  Filled 2017-10-22: qty 100

## 2017-10-22 MED ORDER — HYDROMORPHONE HCL 1 MG/ML IJ SOLN
INTRAMUSCULAR | Status: AC
  Filled 2017-10-22: qty 0.5

## 2017-10-22 MED ORDER — ACETAMINOPHEN 325 MG PO TABS
650.0000 mg | ORAL_TABLET | ORAL | Status: DC | PRN
  Administered 2017-10-22 (×2): 650 mg via ORAL
  Filled 2017-10-22 (×2): qty 2

## 2017-10-22 MED ORDER — LIDOCAINE 2% (20 MG/ML) 5 ML SYRINGE
INTRAMUSCULAR | Status: DC | PRN
  Administered 2017-10-22: 50 mg via INTRAVENOUS

## 2017-10-22 MED ORDER — ONDANSETRON HCL 4 MG/2ML IJ SOLN
INTRAMUSCULAR | Status: DC | PRN
  Administered 2017-10-22: 4 mg via INTRAVENOUS

## 2017-10-22 MED ORDER — VANCOMYCIN HCL 1000 MG IV SOLR
INTRAVENOUS | Status: DC | PRN
  Administered 2017-10-22: 1000 mg via TOPICAL

## 2017-10-22 MED ORDER — METHOCARBAMOL 1000 MG/10ML IJ SOLN: 500.0000 mg | Freq: Four times a day (QID) | INTRAMUSCULAR | Status: DC | PRN

## 2017-10-22 MED ORDER — CEFAZOLIN SODIUM-DEXTROSE 2-4 GM/100ML-% IV SOLN
2.0000 g | INTRAVENOUS | Status: AC
  Administered 2017-10-22: 2 g via INTRAVENOUS

## 2017-10-22 MED ORDER — FENTANYL CITRATE (PF) 100 MCG/2ML IJ SOLN
INTRAMUSCULAR | Status: AC
  Filled 2017-10-22: qty 2

## 2017-10-22 MED ORDER — HYDROMORPHONE HCL 1 MG/ML IJ SOLN: 1.0000 mg | INTRAMUSCULAR | Status: DC | PRN

## 2017-10-22 MED ORDER — BUPIVACAINE HCL (PF) 0.25 % IJ SOLN
INTRAMUSCULAR | Status: DC | PRN
  Administered 2017-10-22: 20 mL

## 2017-10-22 MED ORDER — FENTANYL CITRATE (PF) 100 MCG/2ML IJ SOLN
50.0000 ug | INTRAMUSCULAR | Status: AC | PRN
  Administered 2017-10-22: 25 ug via INTRAVENOUS
  Administered 2017-10-22 (×2): 50 ug via INTRAVENOUS
  Administered 2017-10-22: 25 ug via INTRAVENOUS
  Administered 2017-10-22: 100 ug via INTRAVENOUS
  Administered 2017-10-22: 50 ug via INTRAVENOUS

## 2017-10-22 MED ORDER — OXYCODONE HCL 5 MG PO TABS: ORAL_TABLET | ORAL | 0 refills | Status: AC

## 2017-10-22 MED ORDER — PHENYLEPHRINE HCL 10 MG/ML IJ SOLN
INTRAMUSCULAR | Status: DC | PRN
  Administered 2017-10-22: 20 ug via INTRAVENOUS
  Administered 2017-10-22: 40 ug via INTRAVENOUS

## 2017-10-22 MED ORDER — SCOPOLAMINE 1 MG/3DAYS TD PT72: 1.0000 | MEDICATED_PATCH | Freq: Once | TRANSDERMAL | Status: DC | PRN

## 2017-10-22 MED ORDER — CELECOXIB 200 MG PO CAPS
200.0000 mg | ORAL_CAPSULE | Freq: Two times a day (BID) | ORAL | Status: DC
  Administered 2017-10-22: 200 mg via ORAL
  Filled 2017-10-22: qty 1

## 2017-10-22 MED ORDER — BUPIVACAINE-EPINEPHRINE (PF) 0.5% -1:200000 IJ SOLN
INTRAMUSCULAR | Status: DC | PRN
  Administered 2017-10-22: 30 mL via PERINEURAL

## 2017-10-22 MED ORDER — METOCLOPRAMIDE HCL 5 MG/ML IJ SOLN: 5.0000 mg | Freq: Three times a day (TID) | INTRAMUSCULAR | Status: DC | PRN

## 2017-10-22 MED ORDER — OXYCODONE HCL 5 MG PO TABS: 5.0000 mg | ORAL_TABLET | Freq: Once | ORAL | Status: DC | PRN

## 2017-10-22 MED ORDER — PROPOFOL 10 MG/ML IV BOLUS
INTRAVENOUS | Status: AC
  Filled 2017-10-22: qty 20

## 2017-10-22 MED ORDER — ACETAMINOPHEN 500 MG PO TABS: 1000.0000 mg | ORAL_TABLET | Freq: Three times a day (TID) | ORAL | 0 refills | Status: AC

## 2017-10-22 MED ORDER — ONDANSETRON HCL 4 MG PO TABS: 4.0000 mg | ORAL_TABLET | Freq: Four times a day (QID) | ORAL | Status: DC | PRN

## 2017-10-22 MED ORDER — ONDANSETRON HCL 4 MG/2ML IJ SOLN
INTRAMUSCULAR | Status: AC
  Filled 2017-10-22: qty 2

## 2017-10-22 MED ORDER — OXYCODONE HCL 5 MG PO TABS
5.0000 mg | ORAL_TABLET | ORAL | Status: DC | PRN
  Filled 2017-10-22: qty 1

## 2017-10-22 MED ORDER — DIPHENHYDRAMINE HCL 12.5 MG/5ML PO ELIX: 12.5000 mg | ORAL_SOLUTION | ORAL | Status: DC | PRN

## 2017-10-22 MED ORDER — CHLORHEXIDINE GLUCONATE 4 % EX LIQD: 60.0000 mL | Freq: Once | CUTANEOUS | Status: DC

## 2017-10-22 MED ORDER — PHENYLEPHRINE HCL 10 MG/ML IJ SOLN
INTRAVENOUS | Status: DC | PRN
  Administered 2017-10-22: 50 ug/min via INTRAVENOUS

## 2017-10-22 MED ORDER — DEXAMETHASONE SODIUM PHOSPHATE 10 MG/ML IJ SOLN
INTRAMUSCULAR | Status: DC | PRN
  Administered 2017-10-22: 10 mg via INTRAVENOUS

## 2017-10-22 MED ORDER — MIDAZOLAM HCL 2 MG/2ML IJ SOLN
1.0000 mg | INTRAMUSCULAR | Status: DC | PRN
  Administered 2017-10-22: 2 mg via INTRAVENOUS

## 2017-10-22 MED ORDER — PROPOFOL 10 MG/ML IV BOLUS
INTRAVENOUS | Status: DC | PRN
  Administered 2017-10-22 (×2): 50 mg via INTRAVENOUS
  Administered 2017-10-22: 200 mg via INTRAVENOUS

## 2017-10-22 MED ORDER — MELOXICAM 7.5 MG PO TABS: 7.5000 mg | ORAL_TABLET | Freq: Every day | ORAL | 2 refills | Status: DC

## 2017-10-22 MED ORDER — OXYCODONE HCL 5 MG PO TABS
ORAL_TABLET | ORAL | Status: AC
  Filled 2017-10-22: qty 2

## 2017-10-22 MED ORDER — CEFAZOLIN SODIUM-DEXTROSE 1-4 GM/50ML-% IV SOLN
1.0000 g | Freq: Four times a day (QID) | INTRAVENOUS | Status: AC
  Administered 2017-10-22 – 2017-10-23 (×3): 1 g via INTRAVENOUS
  Filled 2017-10-22 (×3): qty 50

## 2017-10-22 MED ORDER — BUPIVACAINE HCL (PF) 0.25 % IJ SOLN
INTRAMUSCULAR | Status: AC
  Filled 2017-10-22: qty 30

## 2017-10-22 MED ORDER — ACETAMINOPHEN 10 MG/ML IV SOLN
INTRAVENOUS | Status: DC | PRN
  Administered 2017-10-22: 1000 mg via INTRAVENOUS

## 2017-10-22 MED ORDER — DEXAMETHASONE SODIUM PHOSPHATE 10 MG/ML IJ SOLN: INTRAMUSCULAR | Status: DC | PRN

## 2017-10-22 MED ORDER — OXYCODONE HCL 5 MG/5ML PO SOLN: 5.0000 mg | Freq: Once | ORAL | Status: DC | PRN

## 2017-10-22 MED ORDER — MEPERIDINE HCL 25 MG/ML IJ SOLN: 6.2500 mg | INTRAMUSCULAR | Status: DC | PRN

## 2017-10-22 MED ORDER — PHENYLEPHRINE HCL 10 MG/ML IJ SOLN
INTRAMUSCULAR | Status: AC
  Filled 2017-10-22: qty 1

## 2017-10-22 MED ORDER — CEFAZOLIN SODIUM-DEXTROSE 2-4 GM/100ML-% IV SOLN
INTRAVENOUS | Status: AC
  Filled 2017-10-22: qty 100

## 2017-10-22 MED ORDER — METHOCARBAMOL 500 MG PO TABS
500.0000 mg | ORAL_TABLET | Freq: Four times a day (QID) | ORAL | Status: DC | PRN
  Administered 2017-10-22 – 2017-10-23 (×3): 500 mg via ORAL
  Filled 2017-10-22 (×3): qty 1

## 2017-10-22 MED ORDER — KETOROLAC TROMETHAMINE 30 MG/ML IJ SOLN
INTRAMUSCULAR | Status: DC | PRN
  Administered 2017-10-22: 30 mg via INTRAVENOUS

## 2017-10-22 MED ORDER — OXYCODONE HCL 5 MG PO TABS
10.0000 mg | ORAL_TABLET | ORAL | Status: DC | PRN
  Administered 2017-10-22 – 2017-10-23 (×6): 10 mg via ORAL
  Filled 2017-10-22 (×4): qty 2

## 2017-10-22 MED ORDER — ACETAMINOPHEN 650 MG RE SUPP: 650.0000 mg | RECTAL | Status: DC | PRN

## 2017-10-22 MED ORDER — HYDROMORPHONE HCL 1 MG/ML IJ SOLN
0.2500 mg | INTRAMUSCULAR | Status: DC | PRN
  Administered 2017-10-22 (×3): 0.5 mg via INTRAVENOUS

## 2017-10-22 MED ORDER — MIDAZOLAM HCL 2 MG/2ML IJ SOLN
INTRAMUSCULAR | Status: AC
  Filled 2017-10-22: qty 2

## 2017-10-22 MED ORDER — KETOROLAC TROMETHAMINE 30 MG/ML IJ SOLN
INTRAMUSCULAR | Status: AC
  Filled 2017-10-22: qty 1

## 2017-10-22 MED ORDER — PROMETHAZINE HCL 25 MG/ML IJ SOLN: 6.2500 mg | INTRAMUSCULAR | Status: DC | PRN

## 2017-10-22 MED ORDER — ONDANSETRON HCL 4 MG/2ML IJ SOLN: 4.0000 mg | Freq: Four times a day (QID) | INTRAMUSCULAR | Status: DC | PRN

## 2017-10-22 MED ORDER — METOCLOPRAMIDE HCL 5 MG PO TABS: 5.0000 mg | ORAL_TABLET | Freq: Three times a day (TID) | ORAL | Status: DC | PRN

## 2017-10-22 MED ORDER — ASPIRIN 81 MG PO TABS: 81.0000 mg | ORAL_TABLET | Freq: Every day | ORAL | 0 refills | Status: AC

## 2017-10-22 MED ORDER — DOCUSATE SODIUM 100 MG PO CAPS: 100.0000 mg | ORAL_CAPSULE | Freq: Two times a day (BID) | ORAL | Status: DC

## 2017-10-22 MED ORDER — ASPIRIN 81 MG PO CHEW: 81.0000 mg | CHEWABLE_TABLET | Freq: Every day | ORAL | Status: DC

## 2017-10-22 MED ORDER — SODIUM CHLORIDE 0.9 % IR SOLN
Status: DC | PRN
  Administered 2017-10-22: 12000 mL

## 2017-10-22 MED ORDER — LACTATED RINGERS IV SOLN
INTRAVENOUS | Status: DC
  Administered 2017-10-22: 10 mL/h via INTRAVENOUS

## 2017-10-22 SURGICAL SUPPLY — 94 items
ANCHOR BUTTON TIGHTROPE ACL RT (Orthopedic Implant) ×2 IMPLANT
ANCHOR SUT SWIVELLOK BIO (Anchor) ×2 IMPLANT
BANDAGE ACE 6X5 VEL STRL LF (GAUZE/BANDAGES/DRESSINGS) IMPLANT
BENZOIN TINCTURE PRP APPL 2/3 (GAUZE/BANDAGES/DRESSINGS) IMPLANT
BLADE AVERAGE 25X9 (BLADE) ×2 IMPLANT
BLADE HEX COATED 2.75 (ELECTRODE) ×4 IMPLANT
BLADE SHAVER BONE 5.0X13 (MISCELLANEOUS) IMPLANT
BLADE SURG 10 STRL SS (BLADE) ×2 IMPLANT
BLADE SURG 15 STRL LF DISP TIS (BLADE) ×1 IMPLANT
BLADE SURG 15 STRL SS (BLADE) ×1
BNDG COHESIVE 4X5 TAN STRL (GAUZE/BANDAGES/DRESSINGS) ×2 IMPLANT
BURR OVAL 8 FLU 4.0X13 (MISCELLANEOUS) ×2 IMPLANT
CANNULA PASSPORT BUTTON 10-40 (CANNULA) ×2 IMPLANT
CANNULA TWIST IN 8.25X7CM (CANNULA) ×2 IMPLANT
CHLORAPREP W/TINT 26ML (MISCELLANEOUS) ×2 IMPLANT
COVER BACK TABLE 60X90IN (DRAPES) ×2 IMPLANT
CUFF TOURNIQUET SINGLE 34IN LL (TOURNIQUET CUFF) ×2 IMPLANT
DECANTER SPIKE VIAL GLASS SM (MISCELLANEOUS) IMPLANT
DISSECTOR 3.5MM X 13CM CVD (MISCELLANEOUS) IMPLANT
DISSECTOR 4.0MMX13CM CVD (MISCELLANEOUS) ×2 IMPLANT
DRAIN PENROSE 1/4X12 LTX STRL (WOUND CARE) ×2 IMPLANT
DRAPE ARTHROSCOPY W/POUCH 90 (DRAPES) ×2 IMPLANT
DRAPE IMP U-DRAPE 54X76 (DRAPES) ×2 IMPLANT
DRAPE INCISE IOBAN 66X45 STRL (DRAPES) ×2 IMPLANT
DRAPE OEC MINIVIEW 54X84 (DRAPES) ×2 IMPLANT
DRAPE SWITCH (DRAPES) ×2 IMPLANT
DRAPE TOP ARMCOVERS (MISCELLANEOUS) ×2 IMPLANT
DRAPE U-SHAPE 47X51 STRL (DRAPES) ×2 IMPLANT
DRILL FLIPCUTTER II 10MM (CUTTER) ×1 IMPLANT
DRSG EMULSION OIL 3X3 NADH (GAUZE/BANDAGES/DRESSINGS) IMPLANT
ELECT REM PT RETURN 9FT ADLT (ELECTROSURGICAL) ×2
ELECTRODE REM PT RTRN 9FT ADLT (ELECTROSURGICAL) ×1 IMPLANT
FIBERSTICK 2 (SUTURE) ×4 IMPLANT
FLIPCUTTER II 10MM (CUTTER) ×2
GAUZE SPONGE 4X4 12PLY STRL (GAUZE/BANDAGES/DRESSINGS) ×4 IMPLANT
GLOVE BIOGEL PI IND STRL 7.0 (GLOVE) ×1 IMPLANT
GLOVE BIOGEL PI IND STRL 8 (GLOVE) ×2 IMPLANT
GLOVE BIOGEL PI INDICATOR 7.0 (GLOVE) ×1
GLOVE BIOGEL PI INDICATOR 8 (GLOVE) ×2
GLOVE ECLIPSE 6.5 STRL STRAW (GLOVE) ×4 IMPLANT
GLOVE ECLIPSE 8.0 STRL XLNG CF (GLOVE) ×4 IMPLANT
GOWN STRL REUS W/ TWL LRG LVL3 (GOWN DISPOSABLE) ×2 IMPLANT
GOWN STRL REUS W/ TWL XL LVL3 (GOWN DISPOSABLE) ×1 IMPLANT
GOWN STRL REUS W/TWL LRG LVL3 (GOWN DISPOSABLE) ×2
GOWN STRL REUS W/TWL XL LVL3 (GOWN DISPOSABLE) ×5 IMPLANT
GRAFT ACHILLES TENDON (Bone Implant) ×2 IMPLANT
GUIDEPIN FLEX PATHFINDER 2.4MM (WIRE) ×2 IMPLANT
IMMOBILIZER KNEE 22 UNIV (SOFTGOODS) ×2 IMPLANT
IMMOBILIZER KNEE 24 THIGH 36 (MISCELLANEOUS) IMPLANT
IMMOBILIZER KNEE 24 UNIV (MISCELLANEOUS)
IV NS IRRIG 3000ML ARTHROMATIC (IV SOLUTION) ×8 IMPLANT
KIT LEG STABILIZATION (KITS) ×4 IMPLANT
KIT TRANSTIBIAL (DISPOSABLE) ×2 IMPLANT
KNEE WRAP E Z 3 GEL PACK (MISCELLANEOUS) ×2 IMPLANT
KNIFE GRAFT ACL 10MM 5952 (MISCELLANEOUS) IMPLANT
KNIFE GRAFT ACL 9MM (MISCELLANEOUS) IMPLANT
MANIFOLD NEPTUNE II (INSTRUMENTS) ×2 IMPLANT
NS IRRIG 1000ML POUR BTL (IV SOLUTION) ×2 IMPLANT
PACK ARTHROSCOPY DSU (CUSTOM PROCEDURE TRAY) ×2 IMPLANT
PACK BASIN DAY SURGERY FS (CUSTOM PROCEDURE TRAY) ×2 IMPLANT
PAD CAST 4YDX4 CTTN HI CHSV (CAST SUPPLIES) ×1 IMPLANT
PADDING CAST COTTON 4X4 STRL (CAST SUPPLIES) ×1
PENCIL BUTTON HOLSTER BLD 10FT (ELECTRODE) ×2 IMPLANT
PROBE BIPOLAR ATHRO 135MM 90D (MISCELLANEOUS) ×2 IMPLANT
SCREW FT BIOCOMP 10X30 (Screw) ×2 IMPLANT
SCREW FT BIOCOMP 9X30 (Screw) ×2 IMPLANT
SCREW INTERFERENCE 7X20MM (Screw) ×2 IMPLANT
SLEEVE SCD COMPRESS KNEE MED (MISCELLANEOUS) ×2 IMPLANT
SPONGE LAP 18X18 X RAY DECT (DISPOSABLE) ×2 IMPLANT
SPONGE LAP 4X18 X RAY DECT (DISPOSABLE) ×4 IMPLANT
STRIP CLOSURE SKIN 1/2X4 (GAUZE/BANDAGES/DRESSINGS) ×2 IMPLANT
SUCTION FRAZIER HANDLE 10FR (MISCELLANEOUS)
SUCTION TUBE FRAZIER 10FR DISP (MISCELLANEOUS) IMPLANT
SUT 2 FIBERLOOP 20 STRT BLUE (SUTURE) ×8
SUT ETHIBOND 5 LR DA (SUTURE) ×4 IMPLANT
SUT FIBERWIRE #2 38 REV NDL BL (SUTURE) ×4
SUT FIBERWIRE #2 38 T-5 BLUE (SUTURE)
SUT MNCRL AB 3-0 PS2 18 (SUTURE) ×2 IMPLANT
SUT VIC AB 0 CT1 27 (SUTURE) ×2
SUT VIC AB 0 CT1 27XCR 8 STRN (SUTURE) ×2 IMPLANT
SUT VIC AB 0 CT1 36 (SUTURE) ×4 IMPLANT
SUT VIC AB 2-0 SH 27 (SUTURE) ×1
SUT VIC AB 2-0 SH 27XBRD (SUTURE) ×1 IMPLANT
SUTURE 2 FIBERLOOP 20 STRT BLU (SUTURE) ×4 IMPLANT
SUTURE FIBERWR #2 38 T-5 BLUE (SUTURE) IMPLANT
SUTURE FIBERWR#2 38 REV NDL BL (SUTURE) ×2 IMPLANT
TAPE CLOTH 3X10 TAN LF (GAUZE/BANDAGES/DRESSINGS) IMPLANT
TAPE FIBER 2MM 7IN #2 BLUE (SUTURE) ×2 IMPLANT
TENDON ANTERIOR TIBIALIS (Tissue) ×2 IMPLANT
TOWEL OR 17X24 6PK STRL BLUE (TOWEL DISPOSABLE) ×6 IMPLANT
TOWEL OR NON WOVEN STRL DISP B (DISPOSABLE) ×4 IMPLANT
TUBE SUCTION HIGH CAP CLEAR NV (SUCTIONS) ×2 IMPLANT
TUBING ARTHROSCOPY IRRIG 16FT (MISCELLANEOUS) ×2 IMPLANT
WATER STERILE IRR 1000ML POUR (IV SOLUTION) ×2 IMPLANT

## 2017-10-22 NOTE — Anesthesia Procedure Notes (Signed)
Procedure Name: LMA Insertion Performed by: Srijan Givan W, CRNA Pre-anesthesia Checklist: Patient identified, Emergency Drugs available, Suction available and Patient being monitored Patient Re-evaluated:Patient Re-evaluated prior to induction Oxygen Delivery Method: Circle system utilized Preoxygenation: Pre-oxygenation with 100% oxygen Induction Type: IV induction Ventilation: Mask ventilation without difficulty LMA: LMA inserted LMA Size: 5.0 Number of attempts: 1 Placement Confirmation: positive ETCO2 Tube secured with: Tape Dental Injury: Teeth and Oropharynx as per pre-operative assessment        
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
## 2017-10-22 NOTE — Transfer of Care (Signed)
Immediate Anesthesia Transfer of Care Note  Patient: Mike [name]  Procedure(s) Performed: LEFT KNEE ARTHROSCOPY WITH MEDIAL MENISECTOMY, LEFT LIGAMENT RECONSTRUCTION EXTRA ARTICULAR (Left Knee) LEFT KNEE ARTHROSCOPY WITH POSTERIOR CRUCIATE LIGAMENT (PCL) RECONSTRUCTION AND POSTERIOR LATERAL CORNER (Left Knee)  Patient Location: PACU  Anesthesia Type:General  Level of Consciousness: awake, alert  and oriented  Airway & Oxygen Therapy: Patient Spontanous Breathing  Post-op Assessment: Report given to RN and Post -op Vital signs reviewed and stable  Post vital signs: Reviewed and stable  Last Vitals:  Vitals:   10/22/17 0815 10/22/17 0820  BP: 121/81   Pulse: 89 (!) 105  Resp: 13 14  Temp:    SpO2: 100% 100%    Last Pain:  Vitals:   10/22/17 0717  TempSrc: Oral  PainSc: 7       Patients Stated Pain Goal: 3 (10/22/17 0717)  Complications: No apparent anesthesia complications

## 2017-10-22 NOTE — Anesthesia Preprocedure Evaluation (Signed)
Anesthesia Evaluation  Patient identified by MRN, date of birth, ID band Patient awake    Reviewed: Allergy & Precautions, NPO status , Patient's Chart, lab work & pertinent test results  Airway Mallampati: II  TM Distance: >3 FB Neck ROM: Full    Dental no notable dental hx. (+) Poor Dentition   Pulmonary Current Smoker,  Hx/o bilateral pneumothoraces due to multiple rib Fx's S/P respiratory failure   Pulmonary exam normal breath sounds clear to auscultation       Cardiovascular Normal cardiovascular exam+ dysrhythmias Supra Ventricular Tachycardia  Rhythm:Regular Rate:Normal     Neuro/Psych PSYCHIATRIC DISORDERS Anxiety Depression negative neurological ROS     GI/Hepatic negative GI ROS, (+)     substance abuse  IV drug use, Hx/o Heroin abuse   Endo/Other  negative endocrine ROS  Renal/GU negative Renal ROS  negative genitourinary   Musculoskeletal TMM and PCL Hx/o C7 Fx Hx/o multiple rib Fx's   Abdominal   Peds  Hematology negative hematology ROS (+)   Anesthesia Other Findings   Reproductive/Obstetrics                             Anesthesia Physical Anesthesia Plan  ASA: II  Anesthesia Plan: General   Post-op Pain Management:    Induction: Intravenous  PONV Risk Score and Plan: 2 and Ondansetron, Midazolam and Treatment may vary due to age or medical condition  Airway Management Planned: Oral ETT  Additional Equipment:   Intra-op Plan:   Post-operative Plan: Extubation in OR  Informed Consent: I have reviewed the patients History and Physical, chart, labs and discussed the procedure including the risks, benefits and alternatives for the proposed anesthesia with the patient or authorized representative who has indicated his/her understanding and acceptance.   Dental advisory given  Plan Discussed with: CRNA, Anesthesiologist and Surgeon  Anesthesia Plan Comments:          Anesthesia Quick Evaluation

## 2017-10-22 NOTE — Anesthesia Postprocedure Evaluation (Signed)
Anesthesia Post Note  Patient: Leonia CoronaDerek B Bouler  Procedure(s) Performed: LEFT KNEE ARTHROSCOPY WITH MEDIAL MENISECTOMY, LEFT LIGAMENT RECONSTRUCTION EXTRA ARTICULAR (Left Knee) LEFT KNEE ARTHROSCOPY WITH POSTERIOR CRUCIATE LIGAMENT (PCL) RECONSTRUCTION AND POSTERIOR LATERAL CORNER (Left Knee)     Patient location during evaluation: PACU Anesthesia Type: General Level of consciousness: awake and alert Pain management: pain level controlled Vital Signs Assessment: post-procedure vital signs reviewed and stable Respiratory status: spontaneous breathing, nonlabored ventilation, respiratory function stable and patient connected to nasal cannula oxygen Cardiovascular status: blood pressure returned to baseline and stable Postop Assessment: no apparent nausea or vomiting Anesthetic complications: no    Last Vitals:  Vitals:   10/22/17 1645 10/22/17 1745  BP: 138/76 115/73  Pulse: (!) 129 (!) 124  Resp: 14 16  Temp: (!) 36.2 C (!) 36.3 C  SpO2: 96% 93%    Last Pain:  Vitals:   10/22/17 1745  TempSrc:   PainSc: Asleep                 Lowella CurbWarren Ray Miller

## 2017-10-22 NOTE — Interval H&P Note (Signed)
History and Physical Interval Note:  10/22/2017 8:31 AM  Mike [name]  has presented today for surgery, with the diagnosis of TEAR OF MENISCUS LEFT KNEE  The various methods of treatment have been discussed with the patient and family. After consideration of risks, benefits and other options for treatment, the patient has consented to  Procedure(s): LEFT KNEE ARTHROSCOPY WITH MEDIAL MENISECTOMY, LEFT LIGAMENT RECONSTRUCTION EXTRA ARTICULAR (Left) LEFT KNEE ARTHROSCOPY WITH POSTERIOR CRUCIATE LIGAMENT (PCL) RECONSTRUCTION (Left) as a surgical intervention .  The patient's history has been reviewed, patient examined, no change in status, stable for surgery.  I have reviewed the patient's chart and labs.  Questions were answered to the patient's satisfaction.     Bjorn Pippinax T Jahniyah Revere
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
## 2017-10-22 NOTE — Anesthesia Procedure Notes (Addendum)
Anesthesia Regional Block: Adductor canal block   Pre-Anesthetic Checklist: ,, timeout performed, Correct Patient, Correct Site, Correct Laterality, Correct Procedure, Correct Position, site marked, Risks and benefits discussed,  Surgical consent,  Pre-op evaluation,  At surgeon's request and post-op pain management  Laterality: Left  Prep: chloraprep       Needles:  Injection technique: Single-shot  Needle Type: Echogenic Stimulator Needle     Needle Length: 9cm  Needle Gauge: 21   Needle insertion depth: 4 cm   Additional Needles:   Procedures:,,,, ultrasound used (permanent image in chart),,,,  Narrative:  Start time: 10/22/2017 8:12 AM End time: 10/22/2017 8:17 AM Injection made incrementally with aspirations every 5 mL.  Performed by: Personally  Anesthesiologist: Mal AmabileFoster, Shamila Lerch, MD  Additional Notes: Timeout performed. Patient sedated. Relevant anatomy ID'd using US. Incremental 2-295ml injection of LA with frequent aspiration. Patient tolerated procedure well.

## 2017-10-22 NOTE — Progress Notes (Signed)
AssistedDr. Foster with left, ultrasound guided, adductor canal block. Side rails up, monitors on throughout procedure. See vital signs in flow sheet. Tolerated Procedure well.  

## 2017-10-23 ENCOUNTER — Encounter (HOSPITAL_BASED_OUTPATIENT_CLINIC_OR_DEPARTMENT_OTHER): Payer: Self-pay | Admitting: Orthopaedic Surgery

## 2017-10-23 DIAGNOSIS — S83522A Sprain of posterior cruciate ligament of left knee, initial encounter: Secondary | ICD-10-CM | POA: Diagnosis not present

## 2018-03-01 ENCOUNTER — Other Ambulatory Visit: Payer: Self-pay | Admitting: Student

## 2018-03-01 DIAGNOSIS — M19172 Post-traumatic osteoarthritis, left ankle and foot: Secondary | ICD-10-CM

## 2018-03-02 ENCOUNTER — Ambulatory Visit
Admission: RE | Admit: 2018-03-02 | Discharge: 2018-03-02 | Disposition: A | Discharge status: Home / Self Care | Payer: Medicaid Other | Source: Ambulatory Visit | Attending: Student | Admitting: Student

## 2018-03-02 DIAGNOSIS — M19172 Post-traumatic osteoarthritis, left ankle and foot: Secondary | ICD-10-CM

## 2018-03-22 ENCOUNTER — Ambulatory Visit (HOSPITAL_COMMUNITY): Payer: Self-pay | Admitting: Student

## 2018-03-22 DIAGNOSIS — S93325S Dislocation of tarsometatarsal joint of left foot, sequela: Secondary | ICD-10-CM | POA: Insufficient documentation

## 2018-03-23 ENCOUNTER — Encounter (HOSPITAL_COMMUNITY): Payer: Self-pay | Admitting: *Deleted

## 2018-03-23 ENCOUNTER — Other Ambulatory Visit: Payer: Self-pay
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
## 2018-03-24 ENCOUNTER — Ambulatory Visit (HOSPITAL_COMMUNITY)
Admission: RE | Admit: 2018-03-24 | Discharge: 2018-03-24 | Disposition: A | Discharge status: Home / Self Care | Payer: Medicaid Other | Source: Ambulatory Visit | Attending: Student | Admitting: Student

## 2018-03-24 ENCOUNTER — Encounter (HOSPITAL_COMMUNITY)
Admission: RE | Disposition: A | Discharge status: Home / Self Care | Payer: Self-pay | Source: Ambulatory Visit | Attending: Student

## 2018-03-24 ENCOUNTER — Ambulatory Visit (HOSPITAL_COMMUNITY): Payer: Medicaid Other | Admitting: Certified Registered"

## 2018-03-24 ENCOUNTER — Encounter (HOSPITAL_COMMUNITY): Payer: Self-pay | Admitting: *Deleted

## 2018-03-24 ENCOUNTER — Ambulatory Visit (HOSPITAL_COMMUNITY): Payer: Medicaid Other

## 2018-03-24 DIAGNOSIS — S93325A Dislocation of tarsometatarsal joint of left foot, initial encounter: Secondary | ICD-10-CM | POA: Diagnosis present

## 2018-03-24 DIAGNOSIS — T8484XA Pain due to internal orthopedic prosthetic devices, implants and grafts, initial encounter: Secondary | ICD-10-CM

## 2018-03-24 DIAGNOSIS — F17200 Nicotine dependence, unspecified, uncomplicated: Secondary | ICD-10-CM | POA: Insufficient documentation

## 2018-03-24 DIAGNOSIS — Z79899 Other long term (current) drug therapy: Secondary | ICD-10-CM | POA: Insufficient documentation

## 2018-03-24 DIAGNOSIS — S93325S Dislocation of tarsometatarsal joint of left foot, sequela: Secondary | ICD-10-CM | POA: Insufficient documentation

## 2018-03-24 DIAGNOSIS — F17210 Nicotine dependence, cigarettes, uncomplicated: Secondary | ICD-10-CM | POA: Diagnosis not present

## 2018-03-24 DIAGNOSIS — Y831 Surgical operation with implant of artificial internal device as the cause of abnormal reaction of the patient, or of later complication, without mention of misadventure at the time of the procedure: Secondary | ICD-10-CM | POA: Diagnosis not present

## 2018-03-24 DIAGNOSIS — F419 Anxiety disorder, unspecified: Secondary | ICD-10-CM | POA: Diagnosis not present

## 2018-03-24 DIAGNOSIS — Z419 Encounter for procedure for purposes other than remedying health state, unspecified: Secondary | ICD-10-CM

## 2018-03-24 DIAGNOSIS — I472 Ventricular tachycardia: Secondary | ICD-10-CM | POA: Diagnosis not present

## 2018-03-24 DIAGNOSIS — F329 Major depressive disorder, single episode, unspecified: Secondary | ICD-10-CM | POA: Insufficient documentation

## 2018-03-24 DIAGNOSIS — M19172 Post-traumatic osteoarthritis, left ankle and foot: Secondary | ICD-10-CM | POA: Insufficient documentation

## 2018-03-24 HISTORY — DX: Personal history of other medical treatment: Z92.89

## 2018-03-24 HISTORY — PX: HARDWARE REMOVAL: SHX979

## 2018-03-24 HISTORY — PX: TARSAL METATARSAL ARTHRODESIS: SHX2481

## 2018-03-24 HISTORY — DX: Post-traumatic stress disorder, unspecified: F43.10

## 2018-03-24 HISTORY — DX: Unspecified osteoarthritis, unspecified site: M19.90

## 2018-03-24 LAB — CBC
HEMATOCRIT: 45.3 % (ref 39.0–52.0)
Hemoglobin: 15.2 g/dL (ref 13.0–17.0)
MCH: 30.8 pg (ref 26.0–34.0)
MCHC: 33.6 g/dL (ref 30.0–36.0)
MCV: 91.9 fL (ref 78.0–100.0)
PLATELETS: 296 10*3/uL (ref 150–400)
RBC: 4.93 MIL/uL (ref 4.22–5.81)
RDW: 13.2 % (ref 11.5–15.5)
WBC: 9.1 10*3/uL (ref 4.0–10.5)

## 2018-03-24 SURGERY — FUSION, TARSOMETATARSAL JOINT
Anesthesia: General | Site: Foot | Laterality: Right

## 2018-03-24 MED ORDER — SODIUM CHLORIDE 0.9 % IJ SOLN
INTRAMUSCULAR | Status: AC
  Filled 2018-03-24: qty 20

## 2018-03-24 MED ORDER — LACTATED RINGERS IV SOLN
INTRAVENOUS | Status: DC | PRN
  Administered 2018-03-24: 08:00:00 via INTRAVENOUS

## 2018-03-24 MED ORDER — MIDAZOLAM HCL 2 MG/2ML IJ SOLN
INTRAMUSCULAR | Status: AC
  Filled 2018-03-24: qty 2

## 2018-03-24 MED ORDER — ACETAMINOPHEN 10 MG/ML IV SOLN
INTRAVENOUS | Status: DC | PRN
  Administered 2018-03-24: 1000 mg via INTRAVENOUS

## 2018-03-24 MED ORDER — VANCOMYCIN HCL 1000 MG IV SOLR
INTRAVENOUS | Status: DC | PRN
  Administered 2018-03-24: 1000 mg via TOPICAL

## 2018-03-24 MED ORDER — FENTANYL CITRATE (PF) 100 MCG/2ML IJ SOLN
INTRAMUSCULAR | Status: DC | PRN
  Administered 2018-03-24: 50 ug via INTRAVENOUS
  Administered 2018-03-24 (×2): 25 ug via INTRAVENOUS
  Administered 2018-03-24: 50 ug via INTRAVENOUS
  Administered 2018-03-24 (×2): 25 ug via INTRAVENOUS
  Administered 2018-03-24: 50 ug via INTRAVENOUS

## 2018-03-24 MED ORDER — OXYCODONE HCL 5 MG PO TABS
5.0000 mg | ORAL_TABLET | Freq: Once | ORAL | Status: AC | PRN
  Administered 2018-03-24: 5 mg via ORAL

## 2018-03-24 MED ORDER — ASPIRIN EC 81 MG PO TBEC: 81.0000 mg | DELAYED_RELEASE_TABLET | Freq: Every day | ORAL | 0 refills | Status: AC

## 2018-03-24 MED ORDER — HYDROMORPHONE HCL 2 MG/ML IJ SOLN
INTRAMUSCULAR | Status: AC
  Administered 2018-03-24: 0.5 mg via INTRAVENOUS
  Filled 2018-03-24: qty 1

## 2018-03-24 MED ORDER — ONDANSETRON HCL 4 MG/2ML IJ SOLN
INTRAMUSCULAR | Status: AC
  Filled 2018-03-24: qty 4

## 2018-03-24 MED ORDER — 0.9 % SODIUM CHLORIDE (POUR BTL) OPTIME
TOPICAL | Status: DC | PRN
  Administered 2018-03-24: 1000 mL

## 2018-03-24 MED ORDER — PROPOFOL 10 MG/ML IV BOLUS
INTRAVENOUS | Status: DC | PRN
  Administered 2018-03-24: 200 mg via INTRAVENOUS

## 2018-03-24 MED ORDER — MIDAZOLAM HCL 5 MG/5ML IJ SOLN
INTRAMUSCULAR | Status: DC | PRN
  Administered 2018-03-24: 2 mg via INTRAVENOUS

## 2018-03-24 MED ORDER — CEFAZOLIN SODIUM-DEXTROSE 2-4 GM/100ML-% IV SOLN
2.0000 g | INTRAVENOUS | Status: AC
  Administered 2018-03-24: 2 g via INTRAVENOUS
  Filled 2018-03-24: qty 100

## 2018-03-24 MED ORDER — PROMETHAZINE HCL 25 MG/ML IJ SOLN: 6.2500 mg | INTRAMUSCULAR | Status: DC | PRN

## 2018-03-24 MED ORDER — LIDOCAINE HCL (CARDIAC) PF 100 MG/5ML IV SOSY
PREFILLED_SYRINGE | INTRAVENOUS | Status: DC | PRN
  Administered 2018-03-24: 60 mg via INTRAVENOUS

## 2018-03-24 MED ORDER — OXYCODONE-ACETAMINOPHEN 5-325 MG PO TABS: 1.0000 | ORAL_TABLET | ORAL | 0 refills | Status: DC | PRN

## 2018-03-24 MED ORDER — PHENYLEPHRINE HCL 10 MG/ML IJ SOLN
INTRAMUSCULAR | Status: DC | PRN
  Administered 2018-03-24 (×3): 80 ug via INTRAVENOUS

## 2018-03-24 MED ORDER — BUPIVACAINE-EPINEPHRINE (PF) 0.5% -1:200000 IJ SOLN
INTRAMUSCULAR | Status: DC | PRN
  Administered 2018-03-24: 10 mL

## 2018-03-24 MED ORDER — ROPIVACAINE HCL 5 MG/ML IJ SOLN
INTRAMUSCULAR | Status: DC | PRN
  Administered 2018-03-24: 30 mL via PERINEURAL

## 2018-03-24 MED ORDER — PROPOFOL 10 MG/ML IV BOLUS
INTRAVENOUS | Status: AC
  Filled 2018-03-24: qty 40

## 2018-03-24 MED ORDER — KETAMINE HCL 100 MG/ML IJ SOLN
INTRAMUSCULAR | Status: AC
  Filled 2018-03-24: qty 1

## 2018-03-24 MED ORDER — MEPERIDINE HCL 50 MG/ML IJ SOLN: 6.2500 mg | INTRAMUSCULAR | Status: DC | PRN

## 2018-03-24 MED ORDER — DEXAMETHASONE SODIUM PHOSPHATE 10 MG/ML IJ SOLN
INTRAMUSCULAR | Status: AC
  Filled 2018-03-24: qty 3

## 2018-03-24 MED ORDER — DEXTROSE 5 % IV SOLN
INTRAVENOUS | Status: DC | PRN
  Administered 2018-03-24: 30 ug/min via INTRAVENOUS

## 2018-03-24 MED ORDER — HYDROMORPHONE HCL 2 MG/ML IJ SOLN
0.2500 mg | INTRAMUSCULAR | Status: DC | PRN
  Administered 2018-03-24 (×2): 0.5 mg via INTRAVENOUS

## 2018-03-24 MED ORDER — BUPIVACAINE-EPINEPHRINE (PF) 0.5% -1:200000 IJ SOLN
INTRAMUSCULAR | Status: AC
  Filled 2018-03-24: qty 30

## 2018-03-24 MED ORDER — OXYCODONE HCL 5 MG/5ML PO SOLN: 5.0000 mg | Freq: Once | ORAL | Status: AC | PRN

## 2018-03-24 MED ORDER — LIDOCAINE 2% (20 MG/ML) 5 ML SYRINGE
INTRAMUSCULAR | Status: AC
  Filled 2018-03-24: qty 20

## 2018-03-24 MED ORDER — BACITRACIN ZINC 500 UNIT/GM EX OINT
TOPICAL_OINTMENT | CUTANEOUS | Status: AC
  Filled 2018-03-24: qty 28.35

## 2018-03-24 MED ORDER — KETAMINE HCL 10 MG/ML IJ SOLN
INTRAMUSCULAR | Status: DC | PRN
  Administered 2018-03-24: 40 mg via INTRAVENOUS

## 2018-03-24 MED ORDER — GLYCOPYRROLATE 0.2 MG/ML IJ SOLN
INTRAMUSCULAR | Status: DC | PRN
  Administered 2018-03-24: .2 mg via INTRAVENOUS

## 2018-03-24 MED ORDER — EPHEDRINE SULFATE 50 MG/ML IJ SOLN
INTRAMUSCULAR | Status: DC | PRN
  Administered 2018-03-24: 10 mg via INTRAVENOUS

## 2018-03-24 MED ORDER — FENTANYL CITRATE (PF) 250 MCG/5ML IJ SOLN
INTRAMUSCULAR | Status: AC
  Filled 2018-03-24: qty 5

## 2018-03-24 MED ORDER — METHOCARBAMOL 750 MG PO TABS: 750.0000 mg | ORAL_TABLET | Freq: Four times a day (QID) | ORAL | 0 refills | Status: DC | PRN

## 2018-03-24 MED ORDER — PHENYLEPHRINE 40 MCG/ML (10ML) SYRINGE FOR IV PUSH (FOR BLOOD PRESSURE SUPPORT)
PREFILLED_SYRINGE | INTRAVENOUS | Status: AC
  Filled 2018-03-24: qty 10

## 2018-03-24 MED ORDER — VANCOMYCIN HCL 1000 MG IV SOLR
INTRAVENOUS | Status: AC
  Filled 2018-03-24: qty 1000

## 2018-03-24 MED ORDER — ACETAMINOPHEN 10 MG/ML IV SOLN
INTRAVENOUS | Status: AC
  Filled 2018-03-24: qty 100

## 2018-03-24 MED ORDER — ONDANSETRON HCL 4 MG/2ML IJ SOLN
INTRAMUSCULAR | Status: DC | PRN
  Administered 2018-03-24: 4 mg via INTRAVENOUS

## 2018-03-24 MED ORDER — OXYCODONE HCL 5 MG PO TABS
ORAL_TABLET | ORAL | Status: AC
  Administered 2018-03-24: 5 mg via ORAL
  Filled 2018-03-24: qty 1

## 2018-03-24 MED ORDER — DEXAMETHASONE SODIUM PHOSPHATE 10 MG/ML IJ SOLN
INTRAMUSCULAR | Status: DC | PRN
  Administered 2018-03-24: 10 mg via INTRAVENOUS

## 2018-03-24 MED ORDER — CHLORHEXIDINE GLUCONATE 4 % EX LIQD: 60.0000 mL | Freq: Once | CUTANEOUS | Status: DC

## 2018-03-24 SURGICAL SUPPLY — 72 items
BANDAGE ACE 4X5 VEL STRL LF (GAUZE/BANDAGES/DRESSINGS) ×12 IMPLANT
BANDAGE ACE 6X5 VEL STRL LF (GAUZE/BANDAGES/DRESSINGS) ×6 IMPLANT
BANDAGE ESMARK 6X9 LF (GAUZE/BANDAGES/DRESSINGS) ×2 IMPLANT
BIT DRILL CANN 3.2 (BIT) ×2 IMPLANT
BIT DRILL LCP QC 2X140 (BIT) ×3 IMPLANT
BNDG COHESIVE 6X5 TAN STRL LF (GAUZE/BANDAGES/DRESSINGS) ×3 IMPLANT
BNDG ESMARK 6X9 LF (GAUZE/BANDAGES/DRESSINGS) ×3
BNDG GAUZE ELAST 4 BULKY (GAUZE/BANDAGES/DRESSINGS) IMPLANT
BRUSH SCRUB SURG 4.25 DISP (MISCELLANEOUS) ×6 IMPLANT
CHLORAPREP W/TINT 26ML (MISCELLANEOUS) ×3 IMPLANT
COVER SURGICAL LIGHT HANDLE (MISCELLANEOUS) ×6 IMPLANT
CUFF TOURNIQUET SINGLE 18IN (TOURNIQUET CUFF) IMPLANT
CUFF TOURNIQUET SINGLE 24IN (TOURNIQUET CUFF) IMPLANT
CUFF TOURNIQUET SINGLE 34IN LL (TOURNIQUET CUFF) IMPLANT
DRAPE C-ARM 42X72 X-RAY (DRAPES) IMPLANT
DRAPE C-ARMOR (DRAPES) ×3 IMPLANT
DRAPE U-SHAPE 47X51 STRL (DRAPES) ×3 IMPLANT
DRILL BIT CANN 3.2 (BIT) ×3
DRSG ADAPTIC 3X8 NADH LF (GAUZE/BANDAGES/DRESSINGS) ×9 IMPLANT
ELECT REM PT RETURN 9FT ADLT (ELECTROSURGICAL) ×3
ELECTRODE REM PT RTRN 9FT ADLT (ELECTROSURGICAL) ×2 IMPLANT
GAUZE SPONGE 4X4 12PLY STRL (GAUZE/BANDAGES/DRESSINGS) ×9 IMPLANT
GLOVE BIO SURGEON STRL SZ7.5 (GLOVE) ×12 IMPLANT
GLOVE BIOGEL PI IND STRL 7.5 (GLOVE) ×2 IMPLANT
GLOVE BIOGEL PI INDICATOR 7.5 (GLOVE) ×1
GOWN STRL REUS W/ TWL LRG LVL3 (GOWN DISPOSABLE) ×4 IMPLANT
GOWN STRL REUS W/TWL LRG LVL3 (GOWN DISPOSABLE) ×2
GUIDEWIRE 1.6 (WIRE) ×3
GUIDEWIRE NON THREAD 1.6MM (WIRE) ×6 IMPLANT
GUIDEWIRE ORTH 220X1.6XTROC (WIRE) ×6 IMPLANT
KIT BASIN OR (CUSTOM PROCEDURE TRAY) ×3 IMPLANT
KIT TURNOVER KIT B (KITS) ×3 IMPLANT
MANIFOLD NEPTUNE II (INSTRUMENTS) ×3 IMPLANT
NEEDLE 22X1 1/2 (OR ONLY) (NEEDLE) IMPLANT
NS IRRIG 1000ML POUR BTL (IV SOLUTION) ×3 IMPLANT
PACK ORTHO EXTREMITY (CUSTOM PROCEDURE TRAY) ×3 IMPLANT
PAD ARMBOARD 7.5X6 YLW CONV (MISCELLANEOUS) ×6 IMPLANT
PAD CAST 4YDX4 CTTN HI CHSV (CAST SUPPLIES) ×4 IMPLANT
PADDING CAST COTTON 4X4 STRL (CAST SUPPLIES) ×2
PADDING CAST COTTON 6X4 STRL (CAST SUPPLIES) ×6 IMPLANT
PLATE LOCK VA-LCP F/2.4X2.7 (Plate) ×3 IMPLANT
SCREW CANN HDLS STRDR 4.5X38 (Screw) ×3 IMPLANT
SCREW CANN SD HDL 4.5X44 (Screw) ×3 IMPLANT
SCREW CANN SD HDLS 4.5X34 (Screw) ×3 IMPLANT
SCREW CORTEX 2.7X30 (Screw) ×3 IMPLANT
SCREW LOCK T8 24X2.7XSTVA (Screw) ×2 IMPLANT
SCREW LOCK VA ST 2.7X26 (Screw) ×3 IMPLANT
SCREW LOCK VA ST 2.7X32 (Screw) ×3 IMPLANT
SCREW LOCKING 2.7X24MM (Screw) ×1 IMPLANT
SCREW LOCKING 2.7X28 (Screw) ×3 IMPLANT
SCREW LOCKING VA 2.7X30MM (Screw) ×3 IMPLANT
SPONGE LAP 18X18 X RAY DECT (DISPOSABLE) ×3 IMPLANT
STAPLER VISISTAT 35W (STAPLE) IMPLANT
STOCKINETTE IMPERVIOUS LG (DRAPES) ×3 IMPLANT
STRIP CLOSURE SKIN 1/2X4 (GAUZE/BANDAGES/DRESSINGS) IMPLANT
SUCTION FRAZIER HANDLE 10FR (MISCELLANEOUS)
SUCTION TUBE FRAZIER 10FR DISP (MISCELLANEOUS) IMPLANT
SUT ETHILON 3 0 PS 1 (SUTURE) IMPLANT
SUT MNCRL AB 3-0 PS2 18 (SUTURE) ×3 IMPLANT
SUT MON AB 2-0 CT1 36 (SUTURE) ×3 IMPLANT
SUT PDS AB 2-0 CT1 27 (SUTURE) IMPLANT
SUT VIC AB 0 CT1 27 (SUTURE)
SUT VIC AB 0 CT1 27XBRD ANBCTR (SUTURE) IMPLANT
SUT VIC AB 2-0 CT1 27 (SUTURE)
SUT VIC AB 2-0 CT1 TAPERPNT 27 (SUTURE) IMPLANT
SYR CONTROL 10ML LL (SYRINGE) IMPLANT
TOWEL OR 17X24 6PK STRL BLUE (TOWEL DISPOSABLE) ×6 IMPLANT
TOWEL OR 17X26 10 PK STRL BLUE (TOWEL DISPOSABLE) ×6 IMPLANT
TUBE CONNECTING 12X1/4 (SUCTIONS) ×3 IMPLANT
UNDERPAD 30X30 (UNDERPADS AND DIAPERS) ×3 IMPLANT
WATER STERILE IRR 1000ML POUR (IV SOLUTION) ×6 IMPLANT
YANKAUER SUCT BULB TIP NO VENT (SUCTIONS) ×3 IMPLANT

## 2018-03-24 NOTE — Interval H&P Note (Signed)
Patient seen and examined. No changes to above. He agrees to proceed with surgery. Consent obtained. Plan to go home after surgery.  Mike LoftsKevin P. Haddix, MD [facility] [phone] (phone)

## 2018-03-24 NOTE — Anesthesia Postprocedure Evaluation (Signed)
Anesthesia Post Note  Patient: Mike [name]  Procedure(s) Performed: Left Midfoot Fusion (Left Foot) Removal of hardware Right Ankle (Right Ankle)     Patient location during evaluation: PACU Anesthesia Type: General Level of consciousness: awake and alert Pain management: pain level controlled Vital Signs Assessment: post-procedure vital signs reviewed and stable Respiratory status: spontaneous breathing, nonlabored ventilation and respiratory function stable Cardiovascular status: blood pressure returned to baseline and stable Postop Assessment: no apparent nausea or vomiting Anesthetic complications: no    Last Vitals:  Vitals:   03/24/18 1220 03/24/18 1230  BP:  120/74  Pulse:  (!) 112  Resp:    Temp: 36.7 C   SpO2:  98%    Last Pain:  Vitals:   03/24/18 1220  TempSrc:   PainSc: 3                  Lowella CurbWarren Ray Miller

## 2018-03-24 NOTE — Transfer of Care (Signed)
Immediate Anesthesia Transfer of Care Note  Patient: Mike [name]  Procedure(s) Performed: Left Midfoot Fusion (Left Foot) Removal of hardware Right Ankle (Right Ankle)  Patient Location: PACU  Anesthesia Type:General and Regional  Level of Consciousness: drowsy  Airway & Oxygen Therapy: Patient Spontanous Breathing and Patient connected to face mask oxygen  Post-op Assessment: Report given to RN and Post -op Vital signs reviewed and stable  Post vital signs: Reviewed and stable  Last Vitals:  Vitals Value Taken Time  BP 112/67 03/24/2018 11:39 AM  Temp    Pulse 100 03/24/2018 11:43 AM  Resp 9 03/24/2018 11:43 AM  SpO2 100 % 03/24/2018 11:43 AM  Vitals shown include unvalidated device data.  Last Pain:  Vitals:   03/24/18 0639  TempSrc:   PainSc: 6       Patients Stated Pain Goal: 2 (03/24/18 [id number])  Complications: No apparent anesthesia complications

## 2018-03-24 NOTE — Anesthesia Procedure Notes (Signed)
Anesthesia Regional Block: Popliteal block   Pre-Anesthetic Checklist: ,, timeout performed, Correct Patient, Correct Site, Correct Laterality, Correct Procedure, Correct Position, site marked, Risks and benefits discussed,  Surgical consent,  Pre-op evaluation,  At surgeon's request and post-op pain management  Laterality: Left  Prep: chloraprep       Needles:  Injection technique: Single-shot  Needle Type: Stimiplex     Needle Length: 9cm  Needle Gauge: 21     Additional Needles:   Procedures:,,,, ultrasound used (permanent image in chart),,,,  Narrative:  Start time: 03/24/2018 7:56 AM End time: 03/24/2018 8:01 AM Injection made incrementally with aspirations every 5 mL.  Performed by: Personally  Anesthesiologist: Lowella CurbMiller, Christol Thetford Ray, MD
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
## 2018-03-24 NOTE — Discharge Instructions (Addendum)
[facility] Discharge Instructions   General Discharge Instructions  WEIGHT BEARING STATUS:Weight bearing as tolerated right ankle, nonweightbearing left foot  RANGE OF MOTION/ACTIVITY: No restrictions from right ankle and knee  Wound Care: Remove dressing from right ankle on postoperative day 3 (Saturday), if no drainage may leave open to air. Keep splint on left leg clean, dry and intact  DVT/PE prophylaxis:Aspirin 81 mg daily  Diet: as you were eating previously.  Can use over the counter stool softeners and bowel preparations, such as Miralax, to help with bowel movements.  Narcotics can be constipating.  Be sure to drink plenty of fluids  PAIN MEDICATION USE AND EXPECTATIONS  You have likely been given narcotic medications to help control your pain.  After a traumatic event that results in an fracture (broken bone) with or without surgery, it is ok to use narcotic pain medications to help control one's pain.  We understand that everyone responds to pain differently and each individual patient will be evaluated on a regular basis for the continued need for narcotic medications. Ideally, narcotic medication use should last no more than 6-8 weeks (coinciding with fracture healing).   As a patient it is your responsibility as well to monitor narcotic medication use and report the amount and frequency you use these medications when you come to your office visit.   We would also advise that if you are using narcotic medications, you should take a dose prior to therapy to maximize you participation.  IF YOU ARE ON NARCOTIC MEDICATIONS IT IS NOT PERMISSIBLE TO OPERATE A MOTOR VEHICLE (MOTORCYCLE/CAR/TRUCK/MOPED) OR HEAVY MACHINERY DO NOT MIX NARCOTICS WITH OTHER CNS (CENTRAL NERVOUS SYSTEM) DEPRESSANTS SUCH AS ALCOHOL   STOP SMOKING OR USING NICOTINE PRODUCTS!!!!  As discussed nicotine severely impairs your body's ability to heal surgical and traumatic wounds but also impairs  bone healing.  Wounds and bone heal by forming microscopic blood vessels (angiogenesis) and nicotine is a vasoconstrictor (essentially, shrinks blood vessels).  Therefore, if vasoconstriction occurs to these microscopic blood vessels they essentially disappear and are unable to deliver necessary nutrients to the healing tissue.  This is one modifiable factor that you can do to dramatically increase your chances of healing your injury.    (This means no smoking, no nicotine gum, patches, etc)  DO NOT USE NONSTEROIDAL ANTI-INFLAMMATORY DRUGS (NSAID'S)  Using products such as Advil (ibuprofen), Aleve (naproxen), Motrin (ibuprofen) for additional pain control during fracture healing can delay and/or prevent the healing response.  If you would like to take over the counter (OTC) medication, Tylenol (acetaminophen) is ok.  However, some narcotic medications that are given for pain control contain acetaminophen as well. Therefore, you should not exceed more than 4000 mg of tylenol in a day if you do not have liver disease.  Also note that there are may OTC medicines, such as cold medicines and allergy medicines that my contain tylenol as well.  If you have any questions about medications and/or interactions please ask your doctor/PA or your pharmacist.      ICE AND ELEVATE INJURED/OPERATIVE EXTREMITY  Using ice and elevating the injured extremity above your heart can help with swelling and pain control.  Icing in a pulsatile fashion, such as 20 minutes on and 20 minutes off, can be followed.    Do not place ice directly on skin. Make sure there is a barrier between to skin and the ice pack.    Using frozen items such as frozen peas works well as  the conform nicely to the are that needs to be iced.  USE AN ACE WRAP OR TED HOSE FOR SWELLING CONTROL  In addition to icing and elevation, Ace wraps or TED hose are used to help limit and resolve swelling.  It is recommended to use Ace wraps or TED hose until you are  informed to stop.    When using Ace Wraps start the wrapping distally (farthest away from the body) and wrap proximally (closer to the body)   Example: If you had surgery on your leg or thing and you do not have a splint on, start the ace wrap at the toes and work your way up to the thigh        If you had surgery on your upper extremity and do not have a splint on, start the ace wrap at your fingers and work your way up to the upper arm  IF YOU ARE IN A SPLINT OR CAST DO NOT REMOVE IT FOR ANY REASON   If your splint gets wet for any reason please contact the office immediately. You may shower in your splint or cast as long as you keep it dry.  This can be done by wrapping in a cast cover or garbage back (or similar)  Do Not stick any thing down your splint or cast such as pencils, money, or hangers to try and scratch yourself with.  If you feel itchy take benadryl as prescribed on the bottle for itching  IF YOU ARE IN A CAM BOOT (BLACK BOOT)  You may remove boot periodically. Perform daily dressing changes as noted below.  Wash the liner of the boot regularly and wear a sock when wearing the boot. It is recommended that you sleep in the boot until told otherwise  CALL THE OFFICE WITH ANY QUESTIONS OR CONCERNS: [phone]
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
## 2018-03-24 NOTE — Anesthesia Procedure Notes (Addendum)
Procedure Name: LMA Insertion Date/Time: 03/24/2018 8:31 AM Performed by: Ponciano OrtBrewer, Adom Schoeneck, CRNA Pre-anesthesia Checklist: Patient identified, Emergency Drugs available, Suction available and Patient being monitored Patient Re-evaluated:Patient Re-evaluated prior to induction Oxygen Delivery Method: Circle system utilized Preoxygenation: Pre-oxygenation with 100% oxygen Induction Type: IV induction LMA: LMA inserted LMA Size: 5.0 Number of attempts: 1 Placement Confirmation: positive ETCO2 Tube secured with: Tape Dental Injury: Teeth and Oropharynx as per pre-operative assessment

## 2018-03-29 ENCOUNTER — Encounter (HOSPITAL_COMMUNITY): Payer: Self-pay | Admitting: Student

## 2018-07-06 ENCOUNTER — Ambulatory Visit: Payer: Self-pay | Admitting: Student

## 2018-07-06 DIAGNOSIS — M19171 Post-traumatic osteoarthritis, right ankle and foot: Secondary | ICD-10-CM | POA: Insufficient documentation

## 2018-07-09 ENCOUNTER — Encounter (HOSPITAL_COMMUNITY): Payer: Self-pay | Admitting: *Deleted

## 2018-07-09 ENCOUNTER — Other Ambulatory Visit: Payer: Self-pay

## 2018-07-11 NOTE — Anesthesia Preprocedure Evaluation (Addendum)
Anesthesia Evaluation  Patient identified by MRN, date of birth, ID band Patient awake    Reviewed: Allergy & Precautions, NPO status , Patient's Chart, lab work & pertinent test results  Airway Mallampati: I  TM Distance: >3 FB Neck ROM: full    Dental  (+) Teeth Intact, Dental Advidsory Given   Pulmonary Current Smoker,    Pulmonary exam normal        Cardiovascular negative cardio ROS   Rhythm:regular Rate:Normal     Neuro/Psych PSYCHIATRIC DISORDERS negative neurological ROS     GI/Hepatic negative GI ROS, Neg liver ROS,   Endo/Other  negative endocrine ROS  Renal/GU negative Renal ROS     Musculoskeletal   Abdominal   Peds  Hematology negative hematology ROS (+)   Anesthesia Other Findings   Reproductive/Obstetrics                           Lab Results  Component Value Date   WBC 9.1 03/24/2018   HGB 15.2 03/24/2018   HCT 45.3 03/24/2018   MCV 91.9 03/24/2018   PLT 296 03/24/2018   Lab Results  Component Value Date   CREATININE 0.82 09/17/2017   BUN 11 09/17/2017   NA 141 09/17/2017   K 3.9 09/17/2017   CL 108 09/17/2017   CO2 27 09/17/2017    Anesthesia Physical Anesthesia Plan  ASA: I  Anesthesia Plan: General   Post-op Pain Management: GA combined w/ Regional for post-op pain   Induction: Intravenous  PONV Risk Score and Plan: 1 and Ondansetron, Dexamethasone and Treatment may vary due to age or medical condition  Airway Management Planned: LMA  Additional Equipment:   Intra-op Plan:   Post-operative Plan: Extubation in OR  Informed Consent: I have reviewed the patients History and Physical, chart, labs and discussed the procedure including the risks, benefits and alternatives for the proposed anesthesia with the patient or authorized representative who has indicated his/her understanding and acceptance.   Dental Advisory Given  Plan Discussed with:  CRNA  Anesthesia Plan Comments:       Anesthesia Quick Evaluation

## 2018-07-12 ENCOUNTER — Ambulatory Visit (HOSPITAL_COMMUNITY): Payer: Medicaid Other | Admitting: Anesthesiology

## 2018-07-12 ENCOUNTER — Observation Stay (HOSPITAL_COMMUNITY): Payer: Medicaid Other

## 2018-07-12 ENCOUNTER — Encounter (HOSPITAL_COMMUNITY): Payer: Self-pay | Admitting: Anesthesiology

## 2018-07-12 ENCOUNTER — Other Ambulatory Visit: Payer: Self-pay

## 2018-07-12 ENCOUNTER — Encounter (HOSPITAL_COMMUNITY)
Admission: RE | Disposition: A | Discharge status: Home / Self Care | Payer: Self-pay | Source: Ambulatory Visit | Attending: Student

## 2018-07-12 ENCOUNTER — Observation Stay (HOSPITAL_COMMUNITY)
Admission: RE | Admit: 2018-07-12 | Discharge: 2018-07-13 | Disposition: A | Discharge status: Home / Self Care | Payer: Medicaid Other | Source: Ambulatory Visit | Attending: Student | Admitting: Student

## 2018-07-12 DIAGNOSIS — Z981 Arthrodesis status: Secondary | ICD-10-CM

## 2018-07-12 DIAGNOSIS — F4310 Post-traumatic stress disorder, unspecified: Secondary | ICD-10-CM | POA: Insufficient documentation

## 2018-07-12 DIAGNOSIS — M19171 Post-traumatic osteoarthritis, right ankle and foot: Secondary | ICD-10-CM | POA: Diagnosis present

## 2018-07-12 DIAGNOSIS — F319 Bipolar disorder, unspecified: Secondary | ICD-10-CM | POA: Diagnosis not present

## 2018-07-12 DIAGNOSIS — F17210 Nicotine dependence, cigarettes, uncomplicated: Secondary | ICD-10-CM | POA: Diagnosis not present

## 2018-07-12 DIAGNOSIS — Z79899 Other long term (current) drug therapy: Secondary | ICD-10-CM | POA: Diagnosis not present

## 2018-07-12 DIAGNOSIS — Z419 Encounter for procedure for purposes other than remedying health state, unspecified: Secondary | ICD-10-CM

## 2018-07-12 DIAGNOSIS — T148XXA Other injury of unspecified body region, initial encounter: Secondary | ICD-10-CM

## 2018-07-12 HISTORY — PX: ANKLE FUSION: SHX5718

## 2018-07-12 HISTORY — DX: Low back pain: M54.5

## 2018-07-12 HISTORY — PX: ANKLE FUSION: SHX881

## 2018-07-12 HISTORY — DX: Other chronic pain: G89.29

## 2018-07-12 HISTORY — DX: Bipolar disorder, unspecified: F31.9

## 2018-07-12 HISTORY — DX: Low back pain, unspecified: M54.50

## 2018-07-12 SURGERY — ANKLE FUSION
Anesthesia: General | Site: Ankle | Laterality: Right

## 2018-07-12 MED ORDER — ACETAMINOPHEN 500 MG PO TABS
500.0000 mg | ORAL_TABLET | Freq: Two times a day (BID) | ORAL | Status: DC
  Administered 2018-07-12 – 2018-07-13 (×2): 500 mg via ORAL
  Filled 2018-07-12 (×2): qty 1

## 2018-07-12 MED ORDER — LACTATED RINGERS IV SOLN
INTRAVENOUS | Status: DC | PRN
  Administered 2018-07-12 (×2): via INTRAVENOUS

## 2018-07-12 MED ORDER — CEFAZOLIN SODIUM-DEXTROSE 2-4 GM/100ML-% IV SOLN
2.0000 g | INTRAVENOUS | Status: AC
  Administered 2018-07-12: 2 g via INTRAVENOUS

## 2018-07-12 MED ORDER — ACETAMINOPHEN 325 MG PO TABS: 650.0000 mg | ORAL_TABLET | Freq: Four times a day (QID) | ORAL | Status: DC | PRN

## 2018-07-12 MED ORDER — PROPOFOL 10 MG/ML IV BOLUS
INTRAVENOUS | Status: DC | PRN
  Administered 2018-07-12 (×2): 200 mg via INTRAVENOUS

## 2018-07-12 MED ORDER — TRAZODONE HCL 100 MG PO TABS
100.0000 mg | ORAL_TABLET | Freq: Every day | ORAL | Status: DC
  Administered 2018-07-12: 100 mg via ORAL
  Filled 2018-07-12: qty 1

## 2018-07-12 MED ORDER — TOBRAMYCIN SULFATE 1.2 G IJ SOLR
INTRAMUSCULAR | Status: AC
  Filled 2018-07-12: qty 1.2

## 2018-07-12 MED ORDER — FENTANYL CITRATE (PF) 100 MCG/2ML IJ SOLN
INTRAMUSCULAR | Status: DC | PRN
  Administered 2018-07-12 (×2): 50 ug via INTRAVENOUS
  Administered 2018-07-12 (×2): 25 ug via INTRAVENOUS

## 2018-07-12 MED ORDER — BACITRACIN ZINC 500 UNIT/GM EX OINT
TOPICAL_OINTMENT | CUTANEOUS | Status: AC
  Filled 2018-07-12: qty 28.35

## 2018-07-12 MED ORDER — KETOROLAC TROMETHAMINE 15 MG/ML IJ SOLN
15.0000 mg | Freq: Four times a day (QID) | INTRAMUSCULAR | Status: AC
  Administered 2018-07-12 – 2018-07-13 (×5): 15 mg via INTRAVENOUS
  Filled 2018-07-12 (×5): qty 1

## 2018-07-12 MED ORDER — MIDAZOLAM HCL 2 MG/2ML IJ SOLN
INTRAMUSCULAR | Status: AC
  Filled 2018-07-12: qty 2

## 2018-07-12 MED ORDER — BACITRACIN 500 UNIT/GM EX OINT
TOPICAL_OINTMENT | CUTANEOUS | Status: DC | PRN
  Administered 2018-07-12: 1 via TOPICAL

## 2018-07-12 MED ORDER — PHENYLEPHRINE HCL 10 MG/ML IJ SOLN
INTRAMUSCULAR | Status: DC | PRN
  Administered 2018-07-12: 80 ug via INTRAVENOUS

## 2018-07-12 MED ORDER — BUPIVACAINE-EPINEPHRINE (PF) 0.5% -1:200000 IJ SOLN
INTRAMUSCULAR | Status: DC | PRN
  Administered 2018-07-12: 20 mL
  Administered 2018-07-12: 25 mL via PERINEURAL

## 2018-07-12 MED ORDER — EPHEDRINE SULFATE 50 MG/ML IJ SOLN
INTRAMUSCULAR | Status: DC | PRN
  Administered 2018-07-12: 5 mg via INTRAVENOUS

## 2018-07-12 MED ORDER — ONDANSETRON HCL 4 MG/2ML IJ SOLN
INTRAMUSCULAR | Status: DC | PRN
  Administered 2018-07-12: 4 mg via INTRAVENOUS

## 2018-07-12 MED ORDER — VANCOMYCIN HCL 1000 MG IV SOLR
INTRAVENOUS | Status: DC | PRN
  Administered 2018-07-12: 1000 mg via TOPICAL

## 2018-07-12 MED ORDER — CEFAZOLIN SODIUM-DEXTROSE 2-4 GM/100ML-% IV SOLN
INTRAVENOUS | Status: AC
  Filled 2018-07-12: qty 100

## 2018-07-12 MED ORDER — MIDAZOLAM HCL 5 MG/5ML IJ SOLN
INTRAMUSCULAR | Status: DC | PRN
  Administered 2018-07-12: 2 mg via INTRAVENOUS

## 2018-07-12 MED ORDER — TIZANIDINE HCL 4 MG PO TABS
4.0000 mg | ORAL_TABLET | Freq: Three times a day (TID) | ORAL | Status: DC | PRN
  Administered 2018-07-12: 4 mg via ORAL
  Filled 2018-07-12: qty 1

## 2018-07-12 MED ORDER — GABAPENTIN 400 MG PO CAPS
800.0000 mg | ORAL_CAPSULE | Freq: Three times a day (TID) | ORAL | Status: DC
  Administered 2018-07-12 – 2018-07-13 (×4): 800 mg via ORAL
  Filled 2018-07-12 (×6): qty 2

## 2018-07-12 MED ORDER — FENTANYL CITRATE (PF) 250 MCG/5ML IJ SOLN
INTRAMUSCULAR | Status: AC
  Filled 2018-07-12: qty 5

## 2018-07-12 MED ORDER — OXYCODONE-ACETAMINOPHEN 5-325 MG PO TABS
2.0000 | ORAL_TABLET | Freq: Four times a day (QID) | ORAL | Status: DC | PRN
  Administered 2018-07-13 (×2): 2 via ORAL
  Filled 2018-07-12 (×2): qty 2

## 2018-07-12 MED ORDER — LIDOCAINE 2% (20 MG/ML) 5 ML SYRINGE
INTRAMUSCULAR | Status: DC | PRN
  Administered 2018-07-12: 60 mg via INTRAVENOUS

## 2018-07-12 MED ORDER — GABAPENTIN 800 MG PO TABS
800.0000 mg | ORAL_TABLET | Freq: Three times a day (TID) | ORAL | Status: DC
  Filled 2018-07-12 (×2): qty 1

## 2018-07-12 MED ORDER — OXYCODONE-ACETAMINOPHEN 5-325 MG PO TABS
1.0000 | ORAL_TABLET | ORAL | Status: DC | PRN
  Administered 2018-07-12 – 2018-07-13 (×4): 1 via ORAL
  Filled 2018-07-12 (×4): qty 1

## 2018-07-12 MED ORDER — VANCOMYCIN HCL 1000 MG IV SOLR
INTRAVENOUS | Status: AC
  Filled 2018-07-12: qty 1000

## 2018-07-12 MED ORDER — FENTANYL CITRATE (PF) 100 MCG/2ML IJ SOLN
INTRAMUSCULAR | Status: AC
  Filled 2018-07-12: qty 2

## 2018-07-12 MED ORDER — HYDROMORPHONE HCL 1 MG/ML IJ SOLN
1.0000 mg | INTRAMUSCULAR | Status: DC | PRN
  Administered 2018-07-12 (×3): 1 mg via INTRAVENOUS
  Filled 2018-07-12 (×3): qty 1

## 2018-07-12 MED ORDER — ONDANSETRON HCL 4 MG PO TABS: 4.0000 mg | ORAL_TABLET | Freq: Four times a day (QID) | ORAL | Status: DC | PRN

## 2018-07-12 MED ORDER — CLONIDINE HCL (ANALGESIA) 100 MCG/ML EP SOLN
EPIDURAL | Status: DC | PRN
  Administered 2018-07-12 (×2): 50 ug

## 2018-07-12 MED ORDER — ACETAMINOPHEN 650 MG RE SUPP: 650.0000 mg | Freq: Four times a day (QID) | RECTAL | Status: DC | PRN

## 2018-07-12 MED ORDER — SERTRALINE HCL 100 MG PO TABS
100.0000 mg | ORAL_TABLET | Freq: Every day | ORAL | Status: DC
  Administered 2018-07-13: 100 mg via ORAL
  Filled 2018-07-12: qty 1

## 2018-07-12 MED ORDER — DIPHENHYDRAMINE HCL 12.5 MG/5ML PO ELIX: 12.5000 mg | ORAL_SOLUTION | ORAL | Status: DC | PRN

## 2018-07-12 MED ORDER — CHLORHEXIDINE GLUCONATE 4 % EX LIQD: 60.0000 mL | Freq: Once | CUTANEOUS | Status: DC

## 2018-07-12 MED ORDER — CEFAZOLIN SODIUM-DEXTROSE 2-4 GM/100ML-% IV SOLN
2.0000 g | Freq: Three times a day (TID) | INTRAVENOUS | Status: AC
  Administered 2018-07-12 – 2018-07-13 (×3): 2 g via INTRAVENOUS
  Filled 2018-07-12 (×3): qty 100

## 2018-07-12 MED ORDER — DEXMEDETOMIDINE HCL 200 MCG/2ML IV SOLN
INTRAVENOUS | Status: DC | PRN
  Administered 2018-07-12 (×3): 4 ug via INTRAVENOUS

## 2018-07-12 MED ORDER — DEXAMETHASONE SODIUM PHOSPHATE 10 MG/ML IJ SOLN
INTRAMUSCULAR | Status: DC | PRN
  Administered 2018-07-12: 10 mg via INTRAVENOUS

## 2018-07-12 MED ORDER — PROMETHAZINE HCL 25 MG/ML IJ SOLN: 6.2500 mg | INTRAMUSCULAR | Status: DC | PRN

## 2018-07-12 MED ORDER — FENTANYL CITRATE (PF) 100 MCG/2ML IJ SOLN
25.0000 ug | INTRAMUSCULAR | Status: DC | PRN
  Administered 2018-07-12 (×3): 50 ug via INTRAVENOUS

## 2018-07-12 MED ORDER — ONDANSETRON HCL 4 MG/2ML IJ SOLN: 4.0000 mg | Freq: Four times a day (QID) | INTRAMUSCULAR | Status: DC | PRN

## 2018-07-12 MED ORDER — 0.9 % SODIUM CHLORIDE (POUR BTL) OPTIME
TOPICAL | Status: DC | PRN
  Administered 2018-07-12: 1000 mL

## 2018-07-12 SURGICAL SUPPLY — 73 items
BANDAGE ACE 4X5 VEL STRL LF (GAUZE/BANDAGES/DRESSINGS) ×2 IMPLANT
BANDAGE ACE 6X5 VEL STRL LF (GAUZE/BANDAGES/DRESSINGS) ×2 IMPLANT
BANDAGE ESMARK 6X9 LF (GAUZE/BANDAGES/DRESSINGS) ×1 IMPLANT
BIT DRILL 3.2 (BIT) ×1
BIT DRILL 3.2XCALB NS DISP (BIT) ×1 IMPLANT
BIT DRILL CALIBRATED 2.7 (BIT) ×2 IMPLANT
BIT DRL 3.2XCALB NS DISP (BIT) ×1
BLADE OSCILLATING/SAGITTAL (BLADE) ×1
BLADE SW THK.38XMED NAR THN (BLADE) ×1 IMPLANT
BNDG COHESIVE 4X5 TAN STRL (GAUZE/BANDAGES/DRESSINGS) ×2 IMPLANT
BNDG ESMARK 6X9 LF (GAUZE/BANDAGES/DRESSINGS) ×2
BNDG GAUZE ELAST 4 BULKY (GAUZE/BANDAGES/DRESSINGS) ×2 IMPLANT
BRUSH SCRUB SURG 4.25 DISP (MISCELLANEOUS) ×4 IMPLANT
CHLORAPREP W/TINT 26ML (MISCELLANEOUS) ×2 IMPLANT
COVER SURGICAL LIGHT HANDLE (MISCELLANEOUS) ×2 IMPLANT
DRAPE C-ARM 42X72 X-RAY (DRAPES) ×2 IMPLANT
DRAPE C-ARMOR (DRAPES) ×2 IMPLANT
DRAPE ORTHO SPLIT 77X108 STRL (DRAPES) ×2
DRAPE SURG ORHT 6 SPLT 77X108 (DRAPES) ×2 IMPLANT
DRAPE U-SHAPE 47X51 STRL (DRAPES) ×2 IMPLANT
DRSG ADAPTIC 3X8 NADH LF (GAUZE/BANDAGES/DRESSINGS) ×2 IMPLANT
ELECT REM PT RETURN 9FT ADLT (ELECTROSURGICAL) ×2
ELECTRODE REM PT RTRN 9FT ADLT (ELECTROSURGICAL) ×1 IMPLANT
GAUZE SPONGE 4X4 12PLY STRL (GAUZE/BANDAGES/DRESSINGS) IMPLANT
GAUZE SPONGE 4X4 12PLY STRL LF (GAUZE/BANDAGES/DRESSINGS) ×2 IMPLANT
GLOVE BIO SURGEON STRL SZ7.5 (GLOVE) ×6 IMPLANT
GLOVE BIOGEL PI IND STRL 7.5 (GLOVE) ×1 IMPLANT
GLOVE BIOGEL PI INDICATOR 7.5 (GLOVE) ×1
GOWN STRL REUS W/ TWL LRG LVL3 (GOWN DISPOSABLE) ×2 IMPLANT
GOWN STRL REUS W/TWL LRG LVL3 (GOWN DISPOSABLE) ×2
K-WIRE FX150X2X2 TROC KRSH (WIRE) ×2
K-WIRE TROCAR 2X150 (WIRE) ×2
KIT TURNOVER KIT B (KITS) ×2 IMPLANT
KWIRE FX150X2X2 TROC KRSH (WIRE) ×2 IMPLANT
MANIFOLD NEPTUNE II (INSTRUMENTS) ×2 IMPLANT
NEEDLE 25GAX1.5 (MISCELLANEOUS) ×2 IMPLANT
NEEDLE HYPO 21X1.5 SAFETY (NEEDLE) IMPLANT
NS IRRIG 1000ML POUR BTL (IV SOLUTION) ×2 IMPLANT
PACK TOTAL JOINT (CUSTOM PROCEDURE TRAY) ×2 IMPLANT
PAD ARMBOARD 7.5X6 YLW CONV (MISCELLANEOUS) ×4 IMPLANT
PAD CAST 4YDX4 CTTN HI CHSV (CAST SUPPLIES) ×1 IMPLANT
PADDING CAST COTTON 4X4 STRL (CAST SUPPLIES) ×1
PADDING CAST COTTON 6X4 STRL (CAST SUPPLIES) IMPLANT
PIN W-SCR THD 4.0 AO 140 (PIN) ×2 IMPLANT
PLATE ANKLE FIX TI LG RT (Plate) ×2 IMPLANT
SCREW TI LOCK 40X36 TX10 (Screw) ×2 IMPLANT
SCREW TI LOCK 40X38 TX10 (Screw) ×2 IMPLANT
SCREW TI LOCK 40X40 TX10 (Screw) ×2 IMPLANT
SCREW TI STD 40X32 TX10 (Screw) ×2 IMPLANT
SCREW TI STD 40X34 TX10 (Screw) ×2 IMPLANT
SCREW TI STD 40X36 TX10 (Screw) ×2 IMPLANT
SCREW TI STD 40X38 TX10 (Screw) ×2 IMPLANT
SCREW TI STD 40X40 TX10 (Screw) ×2 IMPLANT
SCREW TI STD 40X42 TX10 (Screw) ×4 IMPLANT
SCREW TI STD 40X48 TX10 (Screw) ×2 IMPLANT
SPLINT PLASTER CAST XFAST 5X30 (CAST SUPPLIES) ×1 IMPLANT
SPLINT PLASTER XFAST SET 5X30 (CAST SUPPLIES) ×1
SPONGE LAP 18X18 X RAY DECT (DISPOSABLE) IMPLANT
STAPLER VISISTAT 35W (STAPLE) ×2 IMPLANT
SUCTION FRAZIER HANDLE 10FR (MISCELLANEOUS) ×1
SUCTION TUBE FRAZIER 10FR DISP (MISCELLANEOUS) ×1 IMPLANT
SUT ETHILON 3 0 PS 1 (SUTURE) ×6 IMPLANT
SUT MON AB 2-0 CT1 36 (SUTURE) ×2 IMPLANT
SUT PROLENE 0 CT (SUTURE) IMPLANT
SUT VIC AB 0 CT1 27 (SUTURE) ×1
SUT VIC AB 0 CT1 27XBRD ANBCTR (SUTURE) ×1 IMPLANT
SUT VIC AB 2-0 CT1 27 (SUTURE) ×2
SUT VIC AB 2-0 CT1 TAPERPNT 27 (SUTURE) ×2 IMPLANT
SYR CONTROL 10ML LL (SYRINGE) ×2 IMPLANT
TOWEL OR 17X24 6PK STRL BLUE (TOWEL DISPOSABLE) ×2 IMPLANT
TOWEL OR 17X26 10 PK STRL BLUE (TOWEL DISPOSABLE) ×4 IMPLANT
UNDERPAD 30X30 (UNDERPADS AND DIAPERS) ×2 IMPLANT
WATER STERILE IRR 1000ML POUR (IV SOLUTION) ×2 IMPLANT

## 2018-07-12 NOTE — Transfer of Care (Signed)
Immediate Anesthesia Transfer of Care Note  Patient: Mike [name]  Procedure(s) Performed: RIGHT ANKLE FUSION (Right Ankle)  Patient Location: PACU  Anesthesia Type:General  Level of Consciousness: awake, alert  and oriented  Airway & Oxygen Therapy: Patient Spontanous Breathing and Patient connected to nasal cannula oxygen  Post-op Assessment: Report given to RN, Post -op Vital signs reviewed and stable and Patient moving all extremities X 4  Post vital signs: Reviewed and stable  Last Vitals:  Vitals Value Taken Time  BP 113/66 07/12/2018 10:05 AM  Temp    Pulse 94 07/12/2018 10:05 AM  Resp 12 07/12/2018 10:05 AM  SpO2 97 % 07/12/2018 10:05 AM  Vitals shown include unvalidated device data.  Last Pain:  Vitals:   07/12/18 0648  TempSrc:   PainSc: 5       Patients Stated Pain Goal: 5 (07/12/18 [id number])  Complications: No apparent anesthesia complications
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
## 2018-07-12 NOTE — Anesthesia Procedure Notes (Signed)
Anesthesia Regional Block: Popliteal block   Pre-Anesthetic Checklist: ,, timeout performed, Correct Patient, Correct Site, Correct Laterality, Correct Procedure, Correct Position, site marked, Risks and benefits discussed,  Surgical consent,  Pre-op evaluation,  At surgeon's request and post-op pain management  Laterality: Right  Prep: chloraprep       Needles:  Injection technique: Single-shot  Needle Type: Echogenic Needle     Needle Length: 9cm  Needle Gauge: 21     Additional Needles:   Procedures:,,,, ultrasound used (permanent image in chart),,,,  Narrative:  Start time: 07/12/2018 7:07 AM End time: 07/12/2018 7:14 AM Injection made incrementally with aspirations every 5 mL.  Performed by: Personally  Anesthesiologist: Marcene DuosFitzgerald, Elsie Sakuma, MD

## 2018-07-12 NOTE — Anesthesia Postprocedure Evaluation (Signed)
Anesthesia Post Note  Patient: Mike [name]  Procedure(s) Performed: RIGHT ANKLE FUSION (Right Ankle)     Patient location during evaluation: PACU Anesthesia Type: General Level of consciousness: awake and alert Pain management: pain level controlled Vital Signs Assessment: post-procedure vital signs reviewed and stable Respiratory status: spontaneous breathing, nonlabored ventilation, respiratory function stable and patient connected to nasal cannula oxygen Cardiovascular status: blood pressure returned to baseline and stable Postop Assessment: no apparent nausea or vomiting Anesthetic complications: no    Last Vitals:  Vitals:   07/12/18 1050 07/12/18 1104  BP: 112/69 118/74  Pulse: 84 86  Resp: 18 17  Temp: 36.6 C 36.6 C  SpO2: 95% 97%    Last Pain:  Vitals:   07/12/18 1104  TempSrc: Oral  PainSc:                  Kennieth RadFitzgerald, Linard Daft E

## 2018-07-12 NOTE — Plan of Care (Signed)

## 2018-07-12 NOTE — Op Note (Signed)
OrthopaedicSurgeryOperativeNote (ZOX:[id number](CSN:[id number]) Date of Surgery: 07/12/2018  Admit Date: 07/12/2018   Diagnoses: Pre-Op Diagnoses: Right ankle post-traumatic arthritis  Post-Op Diagnosis: Same  Procedures: CPT 27870-Arthrodesis of right ankle   Surgeons: Primary: Roby LoftsHaddix, Kevin P, MD   Location:[facility] OR ROOM 03   AnesthesiaGeneral   Antibiotics:Ancef 2g preop   Tourniquettime: Total Tourniquet Time Documented: Thigh (Right) - 106 minutes Total: Thigh (Right) - 106 minutes   EstimatedBloodLoss:10 mL   Complications:None  Specimens:None  Implants: Implant Name Type Inv. Item Serial No. Manufacturer Lot No. LRB No. Used Action  PLATE ANKLE FIX TI LG RT - [id number] Plate PLATE ANKLE FIX TI LG RT  [employer](ORTH,TRAU,BIO,SG)  Right 1 Implanted  SCREW TI LOCK 40X36 TX10 - [id number] Screw SCREW TI LOCK 40X36 TX10  [employer](ORTH,TRAU,BIO,SG)  Right 1 Implanted  SCREW TI LOCK 40X38 TX10 - [id number] Screw SCREW TI LOCK 40X38 TX10  [employer](ORTH,TRAU,BIO,SG)  Right 1 Implanted  SCREW TI LOCK 40X40 TX10 - [id number] Screw SCREW TI LOCK 40X40 TX10  [employer](ORTH,TRAU,BIO,SG)  Right 1 Implanted  SCREW TI STD 40X32 TX10 - [id number] Screw SCREW TI STD 40X32 TX10  [employer](ORTH,TRAU,BIO,SG)  Right 1 Implanted  SCREW TI STD 40X34 TX10 - [id number] Screw SCREW TI STD 40X34 TX10  [employer](ORTH,TRAU,BIO,SG)  Right 1 Implanted  SCREW TI STD 40X36 TX10 - [id number] Screw SCREW TI STD 40X36 TX10  [employer](ORTH,TRAU,BIO,SG)  Right 1 Implanted  SCREW TI STD 40X38 TX10 - [id number] Screw SCREW TI STD 40X38 TX10  [employer](ORTH,TRAU,BIO,SG)  Right 1 Implanted  SCREW TI STD 40X40 TX10 - [id number] Screw SCREW TI STD 40X40 TX10  [employer](ORTH,TRAU,BIO,SG)  Right 1 Implanted  SCREW TI STD 40X42 TX10 - [id number] Screw SCREW TI STD 40X42 TX10  [employer](ORTH,TRAU,BIO,SG)  Right 2 Implanted  SCREW TI STD 40X48 TX10 - [id number] Screw SCREW TI STD 40X48 TX10  ZIMMER  RECON(ORTH,TRAU,BIO,SG)  Right 1 Implanted    IndicationsforSurgery: 27 year old male with a history of a type a open right ankle fracture dislocation that was treated with I&D and external fixation and delayed ORIF.  He went on to develop end-stage right ankle arthritis.  An attempt was made at bracing, nonnarcotic pain medication, and even intra-articular injection which provided no symptomatic relief.  I have discussed at length with the patient risks and benefits of proceeding with ankle arthrodesis. Due to his young age and activity level believe that fusion is the best option. We have exhausted all nonoperative management techniques. Risks discussed included bleeding requiring blood transfusion, bleeding causing a hematoma, infection, malunion, nonunion, damage to surrounding nerves and blood vessels, pain, hardware prominence or irritation, hardware failure, stiffness, hindfoot arthritis, and DVT/PE. He agrees to proceed with surgery.  Operative Findings: Trans-fibular tibiotalar ankle fusion with excision of the distal fibula and placement of the [employer] ankle fix large tibiotalar lateral fusion plate.  Fibular bone was used as autograft.  Procedure: The patient was identified in the preoperative holding area. Consent was confirmed with the patient and their family and all questions were answered. The operative extremity was marked after confirmation with the patient. he was then brought back to the operating room by our anesthesia colleagues.  Carefully transferred over to a radiolucent flat top table.  He was placed under general anesthetic.  A bump was placed under his operative hip.  His leg was positioned on a bone foam. The operative extremity was then prepped and draped in usual sterile fashion. A preoperative timeout was performed  to verify the patient, the procedure, and the extremity. Preoperative antibiotics were dosed.  An Esmarch was used to exsanguinate the leg.  The  tourniquet was inflated to 300 mmHg.  Through his previous lateral incision performed dissection down to the bone.  I elevated the periosteum anterior and posterior thereby exposing the distal fibula.  Using a small oscillating saw I then made a transverse cut at the midshaft portion of the fibula.  I then used an osteotome to free the fibula from the tibia at the syndesmosis.  A mixture of osteotome and knife was used to remove the soft tissue from the distal fibula and I was able to excise this en bloc.  The distal fibula was used for morselized autograft.  This was prepared on the back table.  At this point I distracted the tibiotalar joint and proceeded to curette and debrided the remainder of the articular cartilage.  The lateral tibial plafond and lateral talar dome were completely denuded of cartilage.  There is significant subchondral sclerosis of the lateral talar dome that I used an oscillating saw to remove.  Once I had adequate debridement of the remainder of the articular cartilage I used a drill bit to drill into the cancellus bone to provide a good bleeding base.  The cancellus autograft from the fibula was then placed into the fusion site.  Ankle was then placed at neutral dorsiflexion with a slight external rotation and just a slight amount of valgus through the talar joint.  The lateral ankle fusion plate was then pinned provisionally in place.  Fluoroscopic images showed adequate placement of the plate.  With an assistant holding the ankle in appropriate position I then proceeded to place 5 screws into the talus gaining excellent fixation.  I then used the compressor device and was able to provide compression at the fusion site through the plate.  Plate was then affixed to the tibia and a total of 5 screws were placed proximally.  Final fluoroscopic images were obtained.  Wound was copiously irrigated.  A gram of vancomycin powder was placed into the incision.  It was then closed with 2-0  Monocryl and 3-0 nylon.  A sterile dressing consisting of bacitracin ointment, Adaptic, 4 x 4's and sterile cast padding was placed.  His leg was then positioned in a well-padded short leg splint.  He was awoken from anesthesia and taken to PACU in stable condition.  Post Op Plan/Instructions: The patient will be nonweightbearing to the right lower extremity.  The postoperative Ancef.  He will be admitted for overnight pain control.  He will be placed on aspirin for DVT prophylaxis.  I was present and performed the entire surgery.  Truitt Merle, MD [facility]

## 2018-07-12 NOTE — Anesthesia Procedure Notes (Signed)
Procedure Name: LMA Insertion Date/Time: 07/12/2018 7:40 AM Performed by: Carmela RimaMartinelli, Davyon Fisch F, CRNA Pre-anesthesia Checklist: Timeout performed, Patient being monitored, Suction available, Emergency Drugs available and Patient identified Patient Re-evaluated:Patient Re-evaluated prior to induction Oxygen Delivery Method: Circle system utilized Preoxygenation: Pre-oxygenation with 100% oxygen Induction Type: IV induction Ventilation: Mask ventilation without difficulty LMA: LMA inserted LMA Size: 4.0 Number of attempts: 1 Placement Confirmation: breath sounds checked- equal and bilateral and positive ETCO2 Tube secured with: Tape Dental Injury: Teeth and Oropharynx as per pre-operative assessment

## 2018-07-12 NOTE — Anesthesia Procedure Notes (Signed)
Anesthesia Regional Block: Adductor canal block   Pre-Anesthetic Checklist: ,, timeout performed, Correct Patient, Correct Site, Correct Laterality, Correct Procedure, Correct Position, site marked, Risks and benefits discussed,  Surgical consent,  Pre-op evaluation,  At surgeon's request and post-op pain management  Laterality: Right  Prep: chloraprep       Needles:  Injection technique: Single-shot  Needle Type: Echogenic Needle     Needle Length: 9cm  Needle Gauge: 21     Additional Needles:   Procedures:,,,, ultrasound used (permanent image in chart),,,,  Narrative:  Start time: 07/12/2018 7:15 AM End time: 07/12/2018 7:19 AM Injection made incrementally with aspirations every 5 mL.  Performed by: Personally  Anesthesiologist: Marcene DuosFitzgerald, Sean Malinowski, MD

## 2018-07-13 ENCOUNTER — Encounter (HOSPITAL_COMMUNITY): Payer: Self-pay | Admitting: Student

## 2018-07-13 DIAGNOSIS — M19171 Post-traumatic osteoarthritis, right ankle and foot: Secondary | ICD-10-CM | POA: Diagnosis not present

## 2018-07-13 LAB — CBC
HCT: 41.9 % (ref 39.0–52.0)
Hemoglobin: 14.1 g/dL (ref 13.0–17.0)
MCH: 31 pg (ref 26.0–34.0)
MCHC: 33.7 g/dL (ref 30.0–36.0)
MCV: 92.1 fL (ref 78.0–100.0)
PLATELETS: 289 10*3/uL (ref 150–400)
RBC: 4.55 MIL/uL (ref 4.22–5.81)
RDW: 12.3 % (ref 11.5–15.5)
WBC: 16.2 10*3/uL — ABNORMAL HIGH (ref 4.0–10.5)

## 2018-07-13 MED ORDER — OXYCODONE-ACETAMINOPHEN 5-325 MG PO TABS: 1.0000 | ORAL_TABLET | ORAL | 0 refills | Status: DC | PRN

## 2018-07-13 NOTE — Progress Notes (Signed)
Orthopedic Tech Progress Note Patient Details:  Mike CoronaDerek B [name] 01/28/1991 [id number]  Patient ID: Mike Coronaerek B [name], male   DOB: 10/09/1991, 27 y.o.   MRN: [id number]   Nikki DomCrawford, Mike [name] 07/13/2018, 3:33 PM Viewed order from doctor's order list

## 2018-07-13 NOTE — Discharge Instructions (Signed)
[facility] Discharge Instructions   General Discharge Instructions  WEIGHT BEARING STATUS:No weight bearing on right leg  Wound Care: Keep splint clean, dry and intact  DVT/PE prophylaxis: Take a baby aspirin once a day  Diet: as you were eating previously.  Can use over the counter stool softeners and bowel preparations, such as Miralax, to help with bowel movements.  Narcotics can be constipating.  Be sure to drink plenty of fluids  PAIN MEDICATION USE AND EXPECTATIONS  You have likely been given narcotic medications to help control your pain.  After a traumatic event that results in an fracture (broken bone) with or without surgery, it is ok to use narcotic pain medications to help control one's pain.  We understand that everyone responds to pain differently and each individual patient will be evaluated on a regular basis for the continued need for narcotic medications. Ideally, narcotic medication use should last no more than 6-8 weeks (coinciding with fracture healing).   As a patient it is your responsibility as well to monitor narcotic medication use and report the amount and frequency you use these medications when you come to your office visit.   We would also advise that if you are using narcotic medications, you should take a dose prior to therapy to maximize you participation.  IF YOU ARE ON NARCOTIC MEDICATIONS IT IS NOT PERMISSIBLE TO OPERATE A MOTOR VEHICLE (MOTORCYCLE/CAR/TRUCK/MOPED) OR HEAVY MACHINERY DO NOT MIX NARCOTICS WITH OTHER CNS (CENTRAL NERVOUS SYSTEM) DEPRESSANTS SUCH AS ALCOHOL   STOP SMOKING OR USING NICOTINE PRODUCTS!!!!  As discussed nicotine severely impairs your body's ability to heal surgical and traumatic wounds but also impairs bone healing.  Wounds and bone heal by forming microscopic blood vessels (angiogenesis) and nicotine is a vasoconstrictor (essentially, shrinks blood vessels).  Therefore, if vasoconstriction occurs to these  microscopic blood vessels they essentially disappear and are unable to deliver necessary nutrients to the healing tissue.  This is one modifiable factor that you can do to dramatically increase your chances of healing your injury.    (This means no smoking, no nicotine gum, patches, etc)  DO NOT USE NONSTEROIDAL ANTI-INFLAMMATORY DRUGS (NSAID'S)  Using products such as Advil (ibuprofen), Aleve (naproxen), Motrin (ibuprofen) for additional pain control during fracture healing can delay and/or prevent the healing response.  If you would like to take over the counter (OTC) medication, Tylenol (acetaminophen) is ok.  However, some narcotic medications that are given for pain control contain acetaminophen as well. Therefore, you should not exceed more than 4000 mg of tylenol in a day if you do not have liver disease.  Also note that there are may OTC medicines, such as cold medicines and allergy medicines that my contain tylenol as well.  If you have any questions about medications and/or interactions please ask your doctor/PA or your pharmacist.      ICE AND ELEVATE INJURED/OPERATIVE EXTREMITY  Using ice and elevating the injured extremity above your heart can help with swelling and pain control.  Icing in a pulsatile fashion, such as 20 minutes on and 20 minutes off, can be followed.    Do not place ice directly on skin. Make sure there is a barrier between to skin and the ice pack.    Using frozen items such as frozen peas works well as the conform nicely to the are that needs to be iced.  USE AN ACE WRAP OR TED HOSE FOR SWELLING CONTROL  In addition to icing and elevation, Ace wraps  or TED hose are used to help limit and resolve swelling.  It is recommended to use Ace wraps or TED hose until you are informed to stop.    When using Ace Wraps start the wrapping distally (farthest away from the body) and wrap proximally (closer to the body)   Example: If you had surgery on your leg or thing and you do not  have a splint on, start the ace wrap at the toes and work your way up to the thigh        If you had surgery on your upper extremity and do not have a splint on, start the ace wrap at your fingers and work your way up to the upper arm  IF YOU ARE IN A SPLINT OR CAST DO NOT REMOVE IT FOR ANY REASON   If your splint gets wet for any reason please contact the office immediately. You may shower in your splint or cast as long as you keep it dry.  This can be done by wrapping in a cast cover or garbage back (or similar)  Do Not stick any thing down your splint or cast such as pencils, money, or hangers to try and scratch yourself with.  If you feel itchy take benadryl as prescribed on the bottle for itching  IF YOU ARE IN A CAM BOOT (BLACK BOOT)  You may remove boot periodically. Perform daily dressing changes as noted below.  Wash the liner of the boot regularly and wear a sock when wearing the boot. It is recommended that you sleep in the boot until told otherwise  CALL THE OFFICE WITH ANY QUESTIONS OR CONCERNS: [phone]
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
## 2018-07-13 NOTE — Progress Notes (Signed)
Orthopedic Tech Progress Note Patient Details:  Mike [name] 07/19/1991 [id number]  Ortho Devices Type of Ortho Device: Crutches Ortho Device/Splint Interventions: Application   Post Interventions Patient Tolerated: Well Instructions Provided: Care of device   Mike [name], Mike [name] 07/13/2018, 3:33 PM

## 2018-09-28 ENCOUNTER — Ambulatory Visit: Payer: Self-pay | Admitting: Student

## 2018-09-28 DIAGNOSIS — T8484XA Pain due to internal orthopedic prosthetic devices, implants and grafts, initial encounter: Secondary | ICD-10-CM

## 2018-10-04 ENCOUNTER — Encounter (HOSPITAL_COMMUNITY): Payer: Self-pay | Admitting: *Deleted

## 2018-10-04 ENCOUNTER — Other Ambulatory Visit: Payer: Self-pay

## 2018-10-04 NOTE — Progress Notes (Signed)
Pt denies SOB, chest pain, and being under the care of a cardiologist. Pt denies having a stress test, echo and cardiac cath. Pt denies having an EKG and chest x ray. Pt denies recent labs. Pt stated that he is not currently taking Belbuca. Pt made aware to stop taking  Aspirin,vitamins, fish oil and herbal medications. Do not take any NSAIDs ie: Ibuprofen, Advil, Naproxen (Aleve), Motrin, BC and Goody Powder. Pt verbalized understanding of all pre-op instructions.

## 2018-10-04 NOTE — Anesthesia Preprocedure Evaluation (Addendum)
Anesthesia Evaluation  Patient identified by MRN, date of birth, ID band Patient awake    Reviewed: Allergy & Precautions, NPO status , Patient's Chart, lab work & pertinent test results  History of Anesthesia Complications Negative for: history of anesthetic complications  Airway Mallampati: II  TM Distance: >3 FB Neck ROM: Full    Dental  (+) Dental Advisory Given, Teeth Intact   Pulmonary Current Smoker,    breath sounds clear to auscultation       Cardiovascular negative cardio ROS   Rhythm:Regular Rate:Normal     Neuro/Psych PSYCHIATRIC DISORDERS Anxiety Depression Bipolar Disorder negative neurological ROS     GI/Hepatic Neg liver ROS, GERD  Controlled,  Endo/Other  negative endocrine ROS  Renal/GU negative Renal ROS     Musculoskeletal  (+) Arthritis ,   Abdominal   Peds  Hematology negative hematology ROS (+)   Anesthesia Other Findings   Reproductive/Obstetrics                            Anesthesia Physical Anesthesia Plan  ASA: II  Anesthesia Plan: General   Post-op Pain Management:    Induction: Intravenous  PONV Risk Score and Plan: 2 and Treatment may vary due to age or medical condition, Ondansetron, Dexamethasone and Midazolam  Airway Management Planned: LMA and Oral ETT  Additional Equipment: None  Intra-op Plan:   Post-operative Plan: Extubation in OR  Informed Consent: I have reviewed the patients History and Physical, chart, labs and discussed the procedure including the risks, benefits and alternatives for the proposed anesthesia with the patient or authorized representative who has indicated his/her understanding and acceptance.   Dental advisory given  Plan Discussed with: CRNA and Anesthesiologist  Anesthesia Plan Comments: (LMA unless going prone)       Anesthesia Quick Evaluation
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
## 2018-10-05 ENCOUNTER — Encounter (HOSPITAL_COMMUNITY): Payer: Self-pay

## 2018-10-05 ENCOUNTER — Ambulatory Visit (HOSPITAL_COMMUNITY): Payer: Medicaid Other | Admitting: Anesthesiology

## 2018-10-05 ENCOUNTER — Ambulatory Visit (HOSPITAL_COMMUNITY): Payer: Medicaid Other

## 2018-10-05 ENCOUNTER — Ambulatory Visit (HOSPITAL_COMMUNITY)
Admission: RE | Admit: 2018-10-05 | Discharge: 2018-10-05 | Disposition: A | Discharge status: Home / Self Care | Payer: Medicaid Other | Attending: Student | Admitting: Student

## 2018-10-05 ENCOUNTER — Encounter (HOSPITAL_COMMUNITY)
Admission: RE | Disposition: A | Discharge status: Home / Self Care | Payer: Self-pay | Source: Home / Self Care | Attending: Student

## 2018-10-05 DIAGNOSIS — F17210 Nicotine dependence, cigarettes, uncomplicated: Secondary | ICD-10-CM | POA: Diagnosis not present

## 2018-10-05 DIAGNOSIS — T8484XA Pain due to internal orthopedic prosthetic devices, implants and grafts, initial encounter: Secondary | ICD-10-CM

## 2018-10-05 DIAGNOSIS — R102 Pelvic and perineal pain: Secondary | ICD-10-CM | POA: Insufficient documentation

## 2018-10-05 DIAGNOSIS — Y831 Surgical operation with implant of artificial internal device as the cause of abnormal reaction of the patient, or of later complication, without mention of misadventure at the time of the procedure: Secondary | ICD-10-CM | POA: Diagnosis not present

## 2018-10-05 DIAGNOSIS — F319 Bipolar disorder, unspecified: Secondary | ICD-10-CM | POA: Diagnosis not present

## 2018-10-05 DIAGNOSIS — Z419 Encounter for procedure for purposes other than remedying health state, unspecified: Secondary | ICD-10-CM

## 2018-10-05 DIAGNOSIS — M549 Dorsalgia, unspecified: Secondary | ICD-10-CM | POA: Insufficient documentation

## 2018-10-05 DIAGNOSIS — F4310 Post-traumatic stress disorder, unspecified: Secondary | ICD-10-CM | POA: Insufficient documentation

## 2018-10-05 DIAGNOSIS — Z79899 Other long term (current) drug therapy: Secondary | ICD-10-CM | POA: Diagnosis not present

## 2018-10-05 DIAGNOSIS — Z79891 Long term (current) use of opiate analgesic: Secondary | ICD-10-CM | POA: Insufficient documentation

## 2018-10-05 HISTORY — PX: HARDWARE REMOVAL: SHX979

## 2018-10-05 HISTORY — DX: Pain due to internal orthopedic prosthetic devices, implants and grafts, initial encounter: T84.84XA

## 2018-10-05 HISTORY — DX: Gastro-esophageal reflux disease without esophagitis: K21.9

## 2018-10-05 LAB — COMPREHENSIVE METABOLIC PANEL
ALT: 14 U/L (ref 0–44)
AST: 18 U/L (ref 15–41)
Albumin: 3.7 g/dL (ref 3.5–5.0)
Alkaline Phosphatase: 98 U/L (ref 38–126)
Anion gap: 10 (ref 5–15)
BUN: 7 mg/dL (ref 6–20)
CO2: 27 mmol/L (ref 22–32)
Calcium: 9 mg/dL (ref 8.9–10.3)
Chloride: 104 mmol/L (ref 98–111)
Creatinine, Ser: 1.05 mg/dL (ref 0.61–1.24)
GFR calc Af Amer: >60 mL/min (ref >60)
GFR calc non Af Amer: >60 mL/min (ref >60)
Glucose, Bld: 93 mg/dL (ref 70–99)
POTASSIUM: 3.9 mmol/L (ref 3.5–5.1)
Sodium: 141 mmol/L (ref 135–145)
Total Bilirubin: 0.6 mg/dL (ref 0.3–1.2)
Total Protein: 6.2 g/dL — ABNORMAL LOW (ref 6.5–8.1)

## 2018-10-05 LAB — CBC
HCT: 40.9 % (ref 39.0–52.0)
Hemoglobin: 13.3 g/dL (ref 13.0–17.0)
MCH: 30.1 pg (ref 26.0–34.0)
MCHC: 32.5 g/dL (ref 30.0–36.0)
MCV: 92.5 fL (ref 80.0–100.0)
Platelets: 288 10*3/uL (ref 150–400)
RBC: 4.42 MIL/uL (ref 4.22–5.81)
RDW: 13.3 % (ref 11.5–15.5)
WBC: 7.5 10*3/uL (ref 4.0–10.5)
nRBC: 0.3 % — ABNORMAL HIGH (ref 0.0–0.2)

## 2018-10-05 SURGERY — REMOVAL, HARDWARE
Anesthesia: General | Site: Pelvis | Laterality: Bilateral

## 2018-10-05 MED ORDER — LIDOCAINE 2% (20 MG/ML) 5 ML SYRINGE
INTRAMUSCULAR | Status: DC | PRN
  Administered 2018-10-05: 50 mg via INTRAVENOUS

## 2018-10-05 MED ORDER — OXYCODONE HCL 5 MG PO TABS
5.0000 mg | ORAL_TABLET | Freq: Once | ORAL | Status: AC | PRN
  Administered 2018-10-05: 5 mg via ORAL

## 2018-10-05 MED ORDER — BUPIVACAINE-EPINEPHRINE (PF) 0.25% -1:200000 IJ SOLN
INTRAMUSCULAR | Status: AC
  Filled 2018-10-05: qty 30

## 2018-10-05 MED ORDER — FENTANYL CITRATE (PF) 100 MCG/2ML IJ SOLN
25.0000 ug | INTRAMUSCULAR | Status: DC | PRN
  Administered 2018-10-05 (×2): 50 ug via INTRAVENOUS

## 2018-10-05 MED ORDER — LACTATED RINGERS IV SOLN
INTRAVENOUS | Status: DC | PRN
  Administered 2018-10-05 (×2): via INTRAVENOUS

## 2018-10-05 MED ORDER — CHLORHEXIDINE GLUCONATE 4 % EX LIQD: 60.0000 mL | Freq: Once | CUTANEOUS | Status: DC

## 2018-10-05 MED ORDER — OXYCODONE HCL 5 MG/5ML PO SOLN: 5.0000 mg | Freq: Once | ORAL | Status: AC | PRN

## 2018-10-05 MED ORDER — FENTANYL CITRATE (PF) 100 MCG/2ML IJ SOLN
INTRAMUSCULAR | Status: AC
  Filled 2018-10-05: qty 2

## 2018-10-05 MED ORDER — PROPOFOL 10 MG/ML IV BOLUS
INTRAVENOUS | Status: AC
  Filled 2018-10-05: qty 20

## 2018-10-05 MED ORDER — FENTANYL CITRATE (PF) 100 MCG/2ML IJ SOLN
INTRAMUSCULAR | Status: DC | PRN
  Administered 2018-10-05 (×3): 50 ug via INTRAVENOUS
  Administered 2018-10-05: 25 ug via INTRAVENOUS

## 2018-10-05 MED ORDER — PROPOFOL 10 MG/ML IV BOLUS
INTRAVENOUS | Status: DC | PRN
  Administered 2018-10-05: 100 mg via INTRAVENOUS
  Administered 2018-10-05: 200 mg via INTRAVENOUS

## 2018-10-05 MED ORDER — HYDROCODONE-ACETAMINOPHEN 5-325 MG PO TABS
1.0000 | ORAL_TABLET | Freq: Once | ORAL | Status: AC
  Administered 2018-10-05: 1 via ORAL

## 2018-10-05 MED ORDER — PHENYLEPHRINE 40 MCG/ML (10ML) SYRINGE FOR IV PUSH (FOR BLOOD PRESSURE SUPPORT)
PREFILLED_SYRINGE | INTRAVENOUS | Status: AC
  Filled 2018-10-05: qty 10

## 2018-10-05 MED ORDER — DEXAMETHASONE SODIUM PHOSPHATE 10 MG/ML IJ SOLN
INTRAMUSCULAR | Status: AC
  Filled 2018-10-05: qty 2

## 2018-10-05 MED ORDER — VANCOMYCIN HCL 500 MG IV SOLR
INTRAVENOUS | Status: AC
  Filled 2018-10-05: qty 500

## 2018-10-05 MED ORDER — DEXMEDETOMIDINE HCL 200 MCG/2ML IV SOLN
INTRAVENOUS | Status: DC | PRN
  Administered 2018-10-05: 4 ug via INTRAVENOUS

## 2018-10-05 MED ORDER — MIDAZOLAM HCL 2 MG/2ML IJ SOLN
INTRAMUSCULAR | Status: AC
  Filled 2018-10-05: qty 2

## 2018-10-05 MED ORDER — ONDANSETRON HCL 4 MG/2ML IJ SOLN
INTRAMUSCULAR | Status: DC | PRN
  Administered 2018-10-05: 4 mg via INTRAVENOUS

## 2018-10-05 MED ORDER — 0.9 % SODIUM CHLORIDE (POUR BTL) OPTIME
TOPICAL | Status: DC | PRN
  Administered 2018-10-05: 1000 mL

## 2018-10-05 MED ORDER — BUPIVACAINE HCL 0.25 % IJ SOLN
INTRAMUSCULAR | Status: DC | PRN
  Administered 2018-10-05 (×2): 7 mL

## 2018-10-05 MED ORDER — EPHEDRINE SULFATE 50 MG/ML IJ SOLN
INTRAMUSCULAR | Status: DC | PRN
  Administered 2018-10-05: 5 mg via INTRAVENOUS

## 2018-10-05 MED ORDER — OXYCODONE HCL 5 MG PO TABS
ORAL_TABLET | ORAL | Status: AC
  Filled 2018-10-05: qty 1

## 2018-10-05 MED ORDER — FENTANYL CITRATE (PF) 250 MCG/5ML IJ SOLN
INTRAMUSCULAR | Status: AC
  Filled 2018-10-05: qty 5

## 2018-10-05 MED ORDER — LIDOCAINE 2% (20 MG/ML) 5 ML SYRINGE
INTRAMUSCULAR | Status: AC
  Filled 2018-10-05: qty 5

## 2018-10-05 MED ORDER — PHENYLEPHRINE HCL 10 MG/ML IJ SOLN
INTRAMUSCULAR | Status: DC | PRN
  Administered 2018-10-05 (×4): 80 ug via INTRAVENOUS

## 2018-10-05 MED ORDER — ONDANSETRON HCL 4 MG/2ML IJ SOLN
INTRAMUSCULAR | Status: AC
  Filled 2018-10-05: qty 4

## 2018-10-05 MED ORDER — PROMETHAZINE HCL 25 MG/ML IJ SOLN: 6.2500 mg | INTRAMUSCULAR | Status: DC | PRN

## 2018-10-05 MED ORDER — BUPIVACAINE HCL (PF) 0.25 % IJ SOLN
INTRAMUSCULAR | Status: AC
  Filled 2018-10-05: qty 30

## 2018-10-05 MED ORDER — MIDAZOLAM HCL 5 MG/5ML IJ SOLN
INTRAMUSCULAR | Status: DC | PRN
  Administered 2018-10-05: 2 mg via INTRAVENOUS

## 2018-10-05 MED ORDER — HYDROCODONE-ACETAMINOPHEN 5-325 MG PO TABS: 1.0000 | ORAL_TABLET | Freq: Four times a day (QID) | ORAL | 0 refills | Status: DC | PRN

## 2018-10-05 MED ORDER — DEXAMETHASONE SODIUM PHOSPHATE 10 MG/ML IJ SOLN
INTRAMUSCULAR | Status: DC | PRN
  Administered 2018-10-05: 10 mg via INTRAVENOUS

## 2018-10-05 MED ORDER — HYDROCODONE-ACETAMINOPHEN 5-325 MG PO TABS
ORAL_TABLET | ORAL | Status: AC
  Filled 2018-10-05: qty 1

## 2018-10-05 MED ORDER — CEFAZOLIN SODIUM-DEXTROSE 2-4 GM/100ML-% IV SOLN
2.0000 g | INTRAVENOUS | Status: AC
  Administered 2018-10-05: 2 g via INTRAVENOUS
  Filled 2018-10-05: qty 100

## 2018-10-05 SURGICAL SUPPLY — 49 items
BNDG GAUZE ELAST 4 BULKY (GAUZE/BANDAGES/DRESSINGS) IMPLANT
BRUSH SCRUB SURG 4.25 DISP (MISCELLANEOUS) ×2 IMPLANT
CHLORAPREP W/TINT 26ML (MISCELLANEOUS) ×2 IMPLANT
COVER SURGICAL LIGHT HANDLE (MISCELLANEOUS) ×2 IMPLANT
COVER WAND RF STERILE (DRAPES) ×2 IMPLANT
DERMABOND ADVANCED (GAUZE/BANDAGES/DRESSINGS) ×2
DERMABOND ADVANCED .7 DNX12 (GAUZE/BANDAGES/DRESSINGS) ×2 IMPLANT
DRAPE C-ARM 42X72 X-RAY (DRAPES) ×2 IMPLANT
DRAPE C-ARMOR (DRAPES) ×2 IMPLANT
DRSG ADAPTIC 3X8 NADH LF (GAUZE/BANDAGES/DRESSINGS) IMPLANT
DRSG MEPILEX BORDER 4X4 (GAUZE/BANDAGES/DRESSINGS) ×2 IMPLANT
DRSG MEPILEX BORDER 4X8 (GAUZE/BANDAGES/DRESSINGS) ×2 IMPLANT
ELECT REM PT RETURN 9FT ADLT (ELECTROSURGICAL) ×2
ELECTRODE REM PT RTRN 9FT ADLT (ELECTROSURGICAL) ×1 IMPLANT
GAUZE SPONGE 4X4 12PLY STRL (GAUZE/BANDAGES/DRESSINGS) IMPLANT
GLOVE BIO SURGEON STRL SZ 6.5 (GLOVE) ×6 IMPLANT
GLOVE BIO SURGEON STRL SZ7.5 (GLOVE) ×8 IMPLANT
GLOVE BIOGEL PI IND STRL 6.5 (GLOVE) ×1 IMPLANT
GLOVE BIOGEL PI IND STRL 7.0 (GLOVE) ×1 IMPLANT
GLOVE BIOGEL PI IND STRL 7.5 (GLOVE) ×1 IMPLANT
GLOVE BIOGEL PI INDICATOR 6.5 (GLOVE) ×1
GLOVE BIOGEL PI INDICATOR 7.0 (GLOVE) ×1
GLOVE BIOGEL PI INDICATOR 7.5 (GLOVE) ×1
GOWN STRL REUS W/ TWL LRG LVL3 (GOWN DISPOSABLE) ×4 IMPLANT
GOWN STRL REUS W/TWL LRG LVL3 (GOWN DISPOSABLE) ×4
KIT BASIN OR (CUSTOM PROCEDURE TRAY) ×2 IMPLANT
KIT TURNOVER KIT B (KITS) ×2 IMPLANT
MANIFOLD NEPTUNE II (INSTRUMENTS) ×2 IMPLANT
NEEDLE 22X1 1/2 (OR ONLY) (NEEDLE) ×2 IMPLANT
NS IRRIG 1000ML POUR BTL (IV SOLUTION) ×2 IMPLANT
PACK GENERAL/GYN (CUSTOM PROCEDURE TRAY) ×2 IMPLANT
PAD ARMBOARD 7.5X6 YLW CONV (MISCELLANEOUS) ×4 IMPLANT
SPONGE LAP 18X18 X RAY DECT (DISPOSABLE) ×2 IMPLANT
STAPLER VISISTAT 35W (STAPLE) ×2 IMPLANT
STRIP CLOSURE SKIN 1/2X4 (GAUZE/BANDAGES/DRESSINGS) IMPLANT
SUCTION FRAZIER HANDLE 10FR (MISCELLANEOUS) ×1
SUCTION TUBE FRAZIER 10FR DISP (MISCELLANEOUS) ×1 IMPLANT
SUT ETHILON 3 0 PS 1 (SUTURE) ×2 IMPLANT
SUT MNCRL AB 3-0 PS2 18 (SUTURE) ×2 IMPLANT
SUT MON AB 2-0 CT1 36 (SUTURE) ×4 IMPLANT
SUT VIC AB 0 CT1 27 (SUTURE) ×2
SUT VIC AB 0 CT1 27XBRD ANBCTR (SUTURE) ×2 IMPLANT
SUT VIC AB 2-0 CT1 27 (SUTURE) ×2
SUT VIC AB 2-0 CT1 TAPERPNT 27 (SUTURE) ×2 IMPLANT
SYR CONTROL 10ML LL (SYRINGE) ×2 IMPLANT
TOWEL OR 17X26 10 PK STRL BLUE (TOWEL DISPOSABLE) ×2 IMPLANT
TUBE CONNECTING 12X1/4 (SUCTIONS) ×2 IMPLANT
UNDERPAD 30X30 (UNDERPADS AND DIAPERS) IMPLANT
YANKAUER SUCT BULB TIP NO VENT (SUCTIONS) ×2 IMPLANT

## 2018-10-05 NOTE — Transfer of Care (Signed)
Immediate Anesthesia Transfer of Care Note  Patient: Mike CoronaDerek B Gammage  Procedure(s) Performed: HARDWARE REMOVAL OF PELVIS (Bilateral Pelvis)  Patient Location: PACU  Anesthesia Type:General  Level of Consciousness: awake, alert  and oriented  Airway & Oxygen Therapy: Patient Spontanous Breathing and Patient connected to nasal cannula oxygen  Post-op Assessment: Report given to RN, Post -op Vital signs reviewed and stable and Patient moving all extremities X 4  Post vital signs: Reviewed and stable  Last Vitals:  Vitals Value Taken Time  BP 134/85 10/05/2018  9:24 AM  Temp    Pulse 87 10/05/2018  9:25 AM  Resp 18 10/05/2018  9:25 AM  SpO2 100 % 10/05/2018  9:25 AM  Vitals shown include unvalidated device data.  Last Pain:  Vitals:   10/05/18 0622  TempSrc: Oral  PainSc: 6       Patients Stated Pain Goal: 3 (10/05/18 0622)  Complications: No apparent anesthesia complications

## 2018-10-05 NOTE — Anesthesia Postprocedure Evaluation (Signed)
Anesthesia Post Note  Patient: Mike CoronaDerek B [name]  Procedure(s) Performed: HARDWARE REMOVAL OF PELVIS (Bilateral Pelvis)     Patient location during evaluation: PACU Anesthesia Type: General Level of consciousness: awake and alert Pain management: pain level controlled Vital Signs Assessment: post-procedure vital signs reviewed and stable Respiratory status: spontaneous breathing, nonlabored ventilation and respiratory function stable Cardiovascular status: blood pressure returned to baseline and stable Postop Assessment: no apparent nausea or vomiting Anesthetic complications: no    Last Vitals:  Vitals:   10/05/18 1009 10/05/18 1037  BP: 127/80 138/85  Pulse: 94 (!) 105  Resp: 15 14  Temp: 36.6 C 36.6 C  SpO2: 99% 94%    Last Pain:  Vitals:   10/05/18 0948  TempSrc:   PainSc: 7                  Beryle Lathehomas E Anand Tejada
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
## 2018-10-05 NOTE — Discharge Instructions (Addendum)
° °  [facility] Discharge Instructions   General Discharge Instructions  WEIGHT BEARING STATUS: Weightbearing as tolerated  RANGE OF MOTION/ACTIVITY:No restrictions  Wound Care:Keep dressings on until thurs 12/19 then you may remove and shower  DVT/PE prophylaxis:None needed  Diet: as you were eating previously.  Can use over the counter stool softeners and bowel preparations, such as Miralax, to help with bowel movements.  Narcotics can be constipating.  Be sure to drink plenty of fluids  STOP SMOKING OR USING NICOTINE PRODUCTS!!!!  As discussed nicotine severely impairs your body's ability to heal surgical and traumatic wounds but also impairs bone healing.  Wounds and bone heal by forming microscopic blood vessels (angiogenesis) and nicotine is a vasoconstrictor (essentially, shrinks blood vessels).  Therefore, if vasoconstriction occurs to these microscopic blood vessels they essentially disappear and are unable to deliver necessary nutrients to the healing tissue.  This is one modifiable factor that you can do to dramatically increase your chances of healing your injury.    (This means no smoking, no nicotine gum, patches, etc)  ICE AND ELEVATE INJURED/OPERATIVE EXTREMITY  Using ice and elevating the injured extremity above your heart can help with swelling and pain control.  Icing in a pulsatile fashion, such as 20 minutes on and 20 minutes off, can be followed.    Do not place ice directly on skin. Make sure there is a barrier between to skin and the ice pack.    Using frozen items such as frozen peas works well as the conform nicely to the are that needs to be iced.  CALL THE OFFICE WITH ANY QUESTIONS OR CONCERNS: [phone]

## 2018-10-05 NOTE — Anesthesia Procedure Notes (Signed)
Procedure Name: LMA Insertion Date/Time: 10/05/2018 7:38 AM Performed by: Carmela RimaMartinelli, Aleina Burgio F, CRNA Pre-anesthesia Checklist: Timeout performed, Patient being monitored, Suction available, Emergency Drugs available and Patient identified Patient Re-evaluated:Patient Re-evaluated prior to induction Oxygen Delivery Method: Circle system utilized Preoxygenation: Pre-oxygenation with 100% oxygen Induction Type: IV induction Ventilation: Mask ventilation without difficulty LMA: LMA inserted LMA Size: 5.0 Number of attempts: 1 Placement Confirmation: positive ETCO2 and breath sounds checked- equal and bilateral Tube secured with: Tape Dental Injury: Teeth and Oropharynx as per pre-operative assessment

## 2018-10-06 ENCOUNTER — Encounter (HOSPITAL_COMMUNITY): Payer: Self-pay | Admitting: Student

## 2019-03-09 ENCOUNTER — Other Ambulatory Visit: Payer: Self-pay | Admitting: Orthopaedic Surgery

## 2019-03-09 DIAGNOSIS — S82899K Other fracture of unspecified lower leg, subsequent encounter for closed fracture with nonunion: Secondary | ICD-10-CM

## 2019-03-09 DIAGNOSIS — S92909K Unspecified fracture of unspecified foot, subsequent encounter for fracture with nonunion: Secondary | ICD-10-CM

## 2019-03-09 DIAGNOSIS — S86301D Unspecified injury of muscle(s) and tendon(s) of peroneal muscle group at lower leg level, right leg, subsequent encounter: Secondary | ICD-10-CM

## 2019-03-17 ENCOUNTER — Ambulatory Visit
Admission: RE | Admit: 2019-03-17 | Discharge: 2019-03-17 | Disposition: A | Discharge status: Home / Self Care | Payer: Medicaid Other | Source: Ambulatory Visit | Attending: Orthopaedic Surgery | Admitting: Orthopaedic Surgery

## 2019-03-17 DIAGNOSIS — S92909K Unspecified fracture of unspecified foot, subsequent encounter for fracture with nonunion: Secondary | ICD-10-CM

## 2019-03-17 DIAGNOSIS — S86301D Unspecified injury of muscle(s) and tendon(s) of peroneal muscle group at lower leg level, right leg, subsequent encounter: Secondary | ICD-10-CM

## 2019-03-17 DIAGNOSIS — S82899K Other fracture of unspecified lower leg, subsequent encounter for closed fracture with nonunion: Secondary | ICD-10-CM

## 2019-03-24 ENCOUNTER — Other Ambulatory Visit: Payer: Self-pay | Admitting: Orthopaedic Surgery

## 2019-04-29 NOTE — Pre-Procedure Instructions (Signed)
Emmit Alexanders  04/29/2019      [facility] [address] [place], [place] [place] [address] Phone: [phone] Fax: [phone]    Your procedure is scheduled on May 05, 2019 .  Report to [facility] Entrance "A" at 900 AM.  Call this number if you have problems the morning of surgery:  [phone]   Remember:  Do not drink after midnight.  You may drink clear liquids until 800 AM.  Clear liquids allowed are: Ensure Pre Surgery, Water, Juice (non-citric and without pulp), Clear Tea, Black Coffee only and Gatorade    Take these medicines the morning of surgery with A SIP OF WATER  Cymbalta (Duloxetine) Flonase nasal spray Gabapentin (neurontin) Sertraline (Zoloft)  DO NOT take Vyvanse the day of surgery.   Beginning now, STOP taking any  Nabumetone (Relafen), Diclofenac (voltaren) gel, Aspirin (unless otherwise instructed by your surgeon), Aleve, Naproxen, Ibuprofen, Motrin, Advil, Goody's, BC's, all herbal medications, fish oil, and all vitamins    Do not wear jewelry  Do not wear lotions, powders, or colognes, or deodorant.  Men may shave face and neck.  Do not bring valuables to the hospital.  [facility] is not responsible for any belongings or valuables.  Contacts, dentures or bridgework may not be worn into surgery.  Leave your suitcase in the car.  After surgery it may be brought to your room.  For patients admitted to the hospital, discharge time will be determined by your treatment team.  Patients discharged the day of surgery will not be allowed to drive home.    [facility]- Preparing For Surgery  Before surgery, you can play an important role. Because skin is not sterile, your skin needs to be as free of germs as possible. You can reduce the number of germs on your skin by washing with CHG (chlorahexidine gluconate) Soap before surgery.  CHG is an antiseptic cleaner which kills germs and bonds with the  skin to continue killing germs even after washing.    Oral Hygiene is also important to reduce your risk of infection.  Remember - BRUSH YOUR TEETH THE MORNING OF SURGERY WITH YOUR REGULAR TOOTHPASTE  Please do not use if you have an allergy to CHG or antibacterial soaps. If your skin becomes reddened/irritated stop using the CHG.  Do not shave (including legs and underarms) for at least 48 hours prior to first CHG shower. It is OK to shave your face.  Please follow these instructions carefully.   1. Shower the NIGHT BEFORE SURGERY and the MORNING OF SURGERY with CHG.   2. If you chose to wash your hair, wash your hair first as usual with your normal shampoo.  3. After you shampoo, rinse your hair and body thoroughly to remove the shampoo.  4. Use CHG as you would any other liquid soap. You can apply CHG directly to the skin and wash gently with a scrungie or a clean washcloth.   5. Apply the CHG Soap to your body ONLY FROM THE NECK DOWN.  Do not use on open wounds or open sores. Avoid contact with your eyes, ears, mouth and genitals (private parts). Wash Face and genitals (private parts)  with your normal soap.  6. Wash thoroughly, paying special attention to the area where your surgery will be performed.  7. Thoroughly rinse your body with warm water from the neck down.  8. DO NOT shower/wash with your normal soap  after using and rinsing off the CHG Soap.  9. Pat yourself dry with a CLEAN TOWEL.  10. Wear CLEAN PAJAMAS to bed the night before surgery, wear comfortable clothes the morning of surgery  11. Place CLEAN SHEETS on your bed the night of your first shower and DO NOT SLEEP WITH PETS.  Day of Surgery:  Do not apply any deodorants/lotions.  Please wear clean clothes to the hospital/surgery center.   Remember to brush your teeth WITH YOUR REGULAR TOOTHPASTE.   Please read over the following fact sheets that you were given.

## 2019-05-02 ENCOUNTER — Other Ambulatory Visit (HOSPITAL_COMMUNITY)
Admission: RE | Admit: 2019-05-02 | Discharge: 2019-05-02 | Disposition: A | Discharge status: Home / Self Care | Payer: Medicaid Other | Source: Ambulatory Visit | Attending: Orthopaedic Surgery | Admitting: Orthopaedic Surgery

## 2019-05-02 ENCOUNTER — Other Ambulatory Visit: Payer: Self-pay

## 2019-05-02 ENCOUNTER — Encounter (HOSPITAL_COMMUNITY): Payer: Self-pay

## 2019-05-02 ENCOUNTER — Encounter (HOSPITAL_COMMUNITY)
Admission: RE | Admit: 2019-05-02 | Discharge: 2019-05-02 | Disposition: A | Discharge status: Home / Self Care | Payer: Medicaid Other | Source: Ambulatory Visit | Attending: Orthopaedic Surgery | Admitting: Orthopaedic Surgery

## 2019-05-02 DIAGNOSIS — Z01812 Encounter for preprocedural laboratory examination: Secondary | ICD-10-CM | POA: Diagnosis present

## 2019-05-02 DIAGNOSIS — T8484XA Pain due to internal orthopedic prosthetic devices, implants and grafts, initial encounter: Secondary | ICD-10-CM | POA: Diagnosis not present

## 2019-05-02 DIAGNOSIS — S96911A Strain of unspecified muscle and tendon at ankle and foot level, right foot, initial encounter: Secondary | ICD-10-CM | POA: Insufficient documentation

## 2019-05-02 DIAGNOSIS — Z1159 Encounter for screening for other viral diseases: Secondary | ICD-10-CM | POA: Insufficient documentation

## 2019-05-02 LAB — CBC
HCT: 46 % (ref 39.0–52.0)
Hemoglobin: 15.8 g/dL (ref 13.0–17.0)
MCH: 31.3 pg (ref 26.0–34.0)
MCHC: 34.3 g/dL (ref 30.0–36.0)
MCV: 91.1 fL (ref 80.0–100.0)
Platelets: 308 10*3/uL (ref 150–400)
RBC: 5.05 MIL/uL (ref 4.22–5.81)
RDW: 12.5 % (ref 11.5–15.5)
WBC: 7.2 10*3/uL (ref 4.0–10.5)
nRBC: 0 % (ref 0.0–0.2)

## 2019-05-02 LAB — SURGICAL PCR SCREEN
MRSA, PCR: NEGATIVE
Staphylococcus aureus: POSITIVE — AB

## 2019-05-02 NOTE — Progress Notes (Signed)
  Coronavirus Screening COVID test scheduled today at [facility] Have you experienced the following symptoms:  Cough yes/no: No Fever (>100.76F)  yes/no: No Runny nose yes/no: No Sore throat yes/no: No Difficulty breathing/shortness of breath  yes/no: No Have you or a family member traveled in the last 14 days and where? yes/no: No  PCP - Dr. Martina Sinner ([facility])  Cardiologist - denies  Chest x-ray - NA  EKG - NA  Stress Test - denies  ECHO - denies  Cardiac Cath - denies  AICD-denies PM-denies LOOP-denies  Sleep Study - denies CPAP - NA LABS-PCR,CBC ASA-denies  ERAS- Pre-surgery Ensure given with instructions  HA1C-denies Fasting Blood Sugar - NA Checks Blood Sugar ___0__ times a day  Anesthesia-N  Pt denies having chest pain, sob, or fever at this time. All instructions explained to the pt, with a verbal understanding of the material. Pt agrees to go over the instructions while at home for a better understanding. Pt also instructed to self quarantine after being tested for COVID-19. The opportunity to ask questions was provided.

## 2019-05-03 LAB — SARS CORONAVIRUS 2 (TAT 6-24 HRS): SARS Coronavirus 2: NEGATIVE

## 2019-05-04 NOTE — Anesthesia Preprocedure Evaluation (Addendum)
Anesthesia Evaluation  Patient identified by MRN, date of birth, ID band Patient awake    Reviewed: Allergy & Precautions, H&P , NPO status , Patient's Chart, lab work & pertinent test results  Airway Mallampati: II  TM Distance: >3 FB Neck ROM: Full    Dental no notable dental hx. (+) Teeth Intact, Dental Advisory Given   Pulmonary Current Smoker,    Pulmonary exam normal breath sounds clear to auscultation       Cardiovascular Exercise Tolerance: Good negative cardio ROS   Rhythm:Regular Rate:Normal     Neuro/Psych Anxiety Depression Bipolar Disorder negative neurological ROS     GI/Hepatic Neg liver ROS, GERD  Controlled,  Endo/Other  negative endocrine ROS  Renal/GU negative Renal ROS  negative genitourinary   Musculoskeletal  (+) Arthritis , Osteoarthritis,    Abdominal   Peds  Hematology negative hematology ROS (+)   Anesthesia Other Findings   Reproductive/Obstetrics negative OB ROS                            Anesthesia Physical Anesthesia Plan  ASA: II  Anesthesia Plan: General   Post-op Pain Management:  Regional for Post-op pain   Induction: Intravenous  PONV Risk Score and Plan: 1 and Ondansetron, Dexamethasone and Midazolam  Airway Management Planned: LMA  Additional Equipment:   Intra-op Plan:   Post-operative Plan: Extubation in OR  Informed Consent: I have reviewed the patients History and Physical, chart, labs and discussed the procedure including the risks, benefits and alternatives for the proposed anesthesia with the patient or authorized representative who has indicated his/her understanding and acceptance.     Dental advisory given  Plan Discussed with: CRNA  Anesthesia Plan Comments:         Anesthesia Quick Evaluation

## 2019-05-05 ENCOUNTER — Ambulatory Visit (HOSPITAL_COMMUNITY): Payer: Medicaid Other

## 2019-05-05 ENCOUNTER — Ambulatory Visit (HOSPITAL_COMMUNITY): Payer: Medicaid Other | Admitting: Anesthesiology

## 2019-05-05 ENCOUNTER — Encounter (HOSPITAL_COMMUNITY): Payer: Self-pay | Admitting: Surgery

## 2019-05-05 ENCOUNTER — Ambulatory Visit (HOSPITAL_COMMUNITY)
Admission: RE | Admit: 2019-05-05 | Discharge: 2019-05-06 | Disposition: A | Discharge status: Home / Self Care | Payer: Medicaid Other | Attending: Orthopaedic Surgery | Admitting: Orthopaedic Surgery

## 2019-05-05 ENCOUNTER — Encounter (HOSPITAL_COMMUNITY)
Admission: RE | Disposition: A | Discharge status: Home / Self Care | Payer: Self-pay | Source: Home / Self Care | Attending: Orthopaedic Surgery

## 2019-05-05 ENCOUNTER — Other Ambulatory Visit: Payer: Self-pay

## 2019-05-05 DIAGNOSIS — F4310 Post-traumatic stress disorder, unspecified: Secondary | ICD-10-CM | POA: Insufficient documentation

## 2019-05-05 DIAGNOSIS — M1990 Unspecified osteoarthritis, unspecified site: Secondary | ICD-10-CM | POA: Diagnosis not present

## 2019-05-05 DIAGNOSIS — M65871 Other synovitis and tenosynovitis, right ankle and foot: Secondary | ICD-10-CM | POA: Insufficient documentation

## 2019-05-05 DIAGNOSIS — Z981 Arthrodesis status: Secondary | ICD-10-CM | POA: Diagnosis not present

## 2019-05-05 DIAGNOSIS — F319 Bipolar disorder, unspecified: Secondary | ICD-10-CM | POA: Insufficient documentation

## 2019-05-05 DIAGNOSIS — F17210 Nicotine dependence, cigarettes, uncomplicated: Secondary | ICD-10-CM | POA: Insufficient documentation

## 2019-05-05 DIAGNOSIS — Z419 Encounter for procedure for purposes other than remedying health state, unspecified: Secondary | ICD-10-CM

## 2019-05-05 DIAGNOSIS — K219 Gastro-esophageal reflux disease without esophagitis: Secondary | ICD-10-CM | POA: Insufficient documentation

## 2019-05-05 DIAGNOSIS — M960 Pseudarthrosis after fusion or arthrodesis: Secondary | ICD-10-CM | POA: Diagnosis present

## 2019-05-05 DIAGNOSIS — Z79899 Other long term (current) drug therapy: Secondary | ICD-10-CM | POA: Diagnosis not present

## 2019-05-05 HISTORY — PX: ANKLE SURGERY: SHX546

## 2019-05-05 HISTORY — PX: BONE MARROW BIOPSY: SHX1253

## 2019-05-05 HISTORY — PX: FOOT TENODESIS: SHX6373

## 2019-05-05 HISTORY — PX: ANKLE FUSION: SHX881

## 2019-05-05 HISTORY — PX: HARDWARE REMOVAL: SHX979

## 2019-05-05 LAB — CREATININE, SERUM
Creatinine, Ser: 1.29 mg/dL — ABNORMAL HIGH (ref 0.61–1.24)
GFR calc Af Amer: >60 mL/min (ref >60)
GFR calc non Af Amer: >60 mL/min (ref >60)

## 2019-05-05 LAB — CBC
HCT: 39.7 % (ref 39.0–52.0)
Hemoglobin: 13.6 g/dL (ref 13.0–17.0)
MCH: 30.8 pg (ref 26.0–34.0)
MCHC: 34.3 g/dL (ref 30.0–36.0)
MCV: 89.8 fL (ref 80.0–100.0)
Platelets: 267 10*3/uL (ref 150–400)
RBC: 4.42 MIL/uL (ref 4.22–5.81)
RDW: 12.3 % (ref 11.5–15.5)
WBC: 11.3 10*3/uL — ABNORMAL HIGH (ref 4.0–10.5)
nRBC: 0 % (ref 0.0–0.2)

## 2019-05-05 SURGERY — ARTHRODESIS ANKLE
Anesthesia: General | Site: Hip | Laterality: Right

## 2019-05-05 MED ORDER — NAPROXEN 250 MG PO TABS
250.0000 mg | ORAL_TABLET | Freq: Two times a day (BID) | ORAL | Status: DC
  Administered 2019-05-05 – 2019-05-06 (×2): 250 mg via ORAL
  Filled 2019-05-05 (×2): qty 1

## 2019-05-05 MED ORDER — CEFAZOLIN SODIUM-DEXTROSE 2-4 GM/100ML-% IV SOLN
2.0000 g | INTRAVENOUS | Status: AC
  Administered 2019-05-05: 08:00:00 2 g via INTRAVENOUS
  Filled 2019-05-05: qty 100

## 2019-05-05 MED ORDER — GABAPENTIN 800 MG PO TABS
800.0000 mg | ORAL_TABLET | Freq: Three times a day (TID) | ORAL | Status: DC
  Filled 2019-05-05 (×3): qty 1

## 2019-05-05 MED ORDER — ACETAMINOPHEN 500 MG PO TABS
1000.0000 mg | ORAL_TABLET | Freq: Four times a day (QID) | ORAL | Status: AC
  Administered 2019-05-05 – 2019-05-06 (×4): 1000 mg via ORAL
  Filled 2019-05-05 (×4): qty 2

## 2019-05-05 MED ORDER — MIDAZOLAM HCL 2 MG/2ML IJ SOLN
INTRAMUSCULAR | Status: AC
  Filled 2019-05-05: qty 2

## 2019-05-05 MED ORDER — HYDROMORPHONE HCL 1 MG/ML IJ SOLN
0.5000 mg | INTRAMUSCULAR | Status: DC | PRN
  Administered 2019-05-05 – 2019-05-06 (×4): 1 mg via INTRAVENOUS
  Filled 2019-05-05 (×4): qty 1

## 2019-05-05 MED ORDER — GLYCOPYRROLATE PF 0.2 MG/ML IJ SOSY
PREFILLED_SYRINGE | INTRAMUSCULAR | Status: AC
  Filled 2019-05-05: qty 1

## 2019-05-05 MED ORDER — GABAPENTIN 400 MG PO CAPS
800.0000 mg | ORAL_CAPSULE | Freq: Three times a day (TID) | ORAL | Status: DC
  Administered 2019-05-05: 800 mg via ORAL
  Filled 2019-05-05: qty 2

## 2019-05-05 MED ORDER — DOCUSATE SODIUM 100 MG PO CAPS
100.0000 mg | ORAL_CAPSULE | Freq: Two times a day (BID) | ORAL | Status: DC
  Administered 2019-05-05 (×2): 100 mg via ORAL
  Filled 2019-05-05 (×2): qty 1

## 2019-05-05 MED ORDER — HEPARIN SODIUM (PORCINE) 1000 UNIT/ML IJ SOLN
INTRAMUSCULAR | Status: AC
  Filled 2019-05-05: qty 1

## 2019-05-05 MED ORDER — 0.9 % SODIUM CHLORIDE (POUR BTL) OPTIME
TOPICAL | Status: DC | PRN
  Administered 2019-05-05: 08:00:00 1000 mL

## 2019-05-05 MED ORDER — ONDANSETRON HCL 4 MG PO TABS: 4.0000 mg | ORAL_TABLET | Freq: Four times a day (QID) | ORAL | Status: DC | PRN

## 2019-05-05 MED ORDER — DEXAMETHASONE SODIUM PHOSPHATE 10 MG/ML IJ SOLN
INTRAMUSCULAR | Status: DC | PRN
  Administered 2019-05-05: 10 mg via INTRAVENOUS

## 2019-05-05 MED ORDER — CEFAZOLIN SODIUM-DEXTROSE 1-4 GM/50ML-% IV SOLN
1.0000 g | Freq: Four times a day (QID) | INTRAVENOUS | Status: AC
  Administered 2019-05-05 (×2): 1 g via INTRAVENOUS
  Filled 2019-05-05 (×2): qty 50

## 2019-05-05 MED ORDER — OXYCODONE HCL 5 MG PO TABS
10.0000 mg | ORAL_TABLET | ORAL | Status: DC | PRN
  Administered 2019-05-05 – 2019-05-06 (×3): 15 mg via ORAL
  Filled 2019-05-05 (×3): qty 3

## 2019-05-05 MED ORDER — PROPOFOL 10 MG/ML IV BOLUS
INTRAVENOUS | Status: AC
  Filled 2019-05-05: qty 20

## 2019-05-05 MED ORDER — ACETAMINOPHEN 500 MG PO TABS
1000.0000 mg | ORAL_TABLET | Freq: Once | ORAL | Status: AC
  Administered 2019-05-05: 1000 mg via ORAL
  Filled 2019-05-05: qty 2

## 2019-05-05 MED ORDER — OXYCODONE HCL 5 MG PO TABS
ORAL_TABLET | ORAL | Status: AC
  Filled 2019-05-05: qty 2

## 2019-05-05 MED ORDER — NAPROXEN 250 MG PO TABS
250.0000 mg | ORAL_TABLET | Freq: Two times a day (BID) | ORAL | Status: DC
  Filled 2019-05-05: qty 1

## 2019-05-05 MED ORDER — ONDANSETRON HCL 4 MG/2ML IJ SOLN: 4.0000 mg | Freq: Four times a day (QID) | INTRAMUSCULAR | Status: DC | PRN

## 2019-05-05 MED ORDER — BUPIVACAINE-EPINEPHRINE (PF) 0.5% -1:200000 IJ SOLN
INTRAMUSCULAR | Status: DC | PRN
  Administered 2019-05-05: 30 mL via PERINEURAL

## 2019-05-05 MED ORDER — ZOLPIDEM TARTRATE 5 MG PO TABS
10.0000 mg | ORAL_TABLET | Freq: Every evening | ORAL | Status: DC | PRN
  Administered 2019-05-05: 10 mg via ORAL
  Filled 2019-05-05: qty 2

## 2019-05-05 MED ORDER — ONDANSETRON HCL 4 MG/2ML IJ SOLN
INTRAMUSCULAR | Status: DC | PRN
  Administered 2019-05-05: 4 mg via INTRAVENOUS

## 2019-05-05 MED ORDER — MIDAZOLAM HCL 5 MG/5ML IJ SOLN
INTRAMUSCULAR | Status: DC | PRN
  Administered 2019-05-05: 2 mg via INTRAVENOUS

## 2019-05-05 MED ORDER — GABAPENTIN 300 MG PO CAPS: 300.0000 mg | ORAL_CAPSULE | Freq: Three times a day (TID) | ORAL | Status: DC

## 2019-05-05 MED ORDER — BUPIVACAINE HCL (PF) 0.5 % IJ SOLN
INTRAMUSCULAR | Status: DC | PRN
  Administered 2019-05-05: 10 mL

## 2019-05-05 MED ORDER — FENTANYL CITRATE (PF) 100 MCG/2ML IJ SOLN
INTRAMUSCULAR | Status: DC | PRN
  Administered 2019-05-05 (×2): 50 ug via INTRAVENOUS
  Administered 2019-05-05: 25 ug via INTRAVENOUS
  Administered 2019-05-05: 50 ug via INTRAVENOUS
  Administered 2019-05-05: 25 ug via INTRAVENOUS
  Administered 2019-05-05: 50 ug via INTRAVENOUS

## 2019-05-05 MED ORDER — ONDANSETRON HCL 4 MG/2ML IJ SOLN
INTRAMUSCULAR | Status: AC
  Filled 2019-05-05: qty 2

## 2019-05-05 MED ORDER — DEXAMETHASONE SODIUM PHOSPHATE 10 MG/ML IJ SOLN
INTRAMUSCULAR | Status: AC
  Filled 2019-05-05: qty 1

## 2019-05-05 MED ORDER — ACETAMINOPHEN 325 MG PO TABS: 325.0000 mg | ORAL_TABLET | Freq: Four times a day (QID) | ORAL | Status: DC | PRN

## 2019-05-05 MED ORDER — METHOCARBAMOL 1000 MG/10ML IJ SOLN
500.0000 mg | Freq: Four times a day (QID) | INTRAVENOUS | Status: DC | PRN
  Filled 2019-05-05: qty 5

## 2019-05-05 MED ORDER — OXYCODONE HCL 5 MG PO TABS
5.0000 mg | ORAL_TABLET | ORAL | Status: DC | PRN
  Administered 2019-05-05: 10 mg via ORAL

## 2019-05-05 MED ORDER — KETAMINE HCL 50 MG/ML IJ SOLN
INTRAMUSCULAR | Status: DC | PRN
  Administered 2019-05-05 (×5): 10 mg via INTRAMUSCULAR

## 2019-05-05 MED ORDER — KETAMINE HCL 50 MG/5ML IJ SOSY
PREFILLED_SYRINGE | INTRAMUSCULAR | Status: AC
  Filled 2019-05-05: qty 5

## 2019-05-05 MED ORDER — METOCLOPRAMIDE HCL 5 MG PO TABS: 5.0000 mg | ORAL_TABLET | Freq: Three times a day (TID) | ORAL | Status: DC | PRN

## 2019-05-05 MED ORDER — PROPOFOL 10 MG/ML IV BOLUS
INTRAVENOUS | Status: DC | PRN
  Administered 2019-05-05: 200 mg via INTRAVENOUS

## 2019-05-05 MED ORDER — GLYCOPYRROLATE PF 0.2 MG/ML IJ SOSY
PREFILLED_SYRINGE | INTRAMUSCULAR | Status: DC | PRN
  Administered 2019-05-05: .2 mg via INTRAVENOUS

## 2019-05-05 MED ORDER — CHLORHEXIDINE GLUCONATE 4 % EX LIQD: 60.0000 mL | Freq: Once | CUTANEOUS | Status: DC

## 2019-05-05 MED ORDER — NAPROXEN 250 MG PO TABS: 250.0000 mg | ORAL_TABLET | Freq: Two times a day (BID) | ORAL | Status: DC

## 2019-05-05 MED ORDER — HYDROMORPHONE HCL 1 MG/ML IJ SOLN: 0.2500 mg | INTRAMUSCULAR | Status: DC | PRN

## 2019-05-05 MED ORDER — METHOCARBAMOL 500 MG PO TABS
500.0000 mg | ORAL_TABLET | Freq: Four times a day (QID) | ORAL | Status: DC | PRN
  Administered 2019-05-05 – 2019-05-06 (×2): 500 mg via ORAL
  Filled 2019-05-05 (×2): qty 1

## 2019-05-05 MED ORDER — HEPARIN SODIUM (PORCINE) 1000 UNIT/ML IJ SOLN
INTRAMUSCULAR | Status: DC | PRN
  Administered 2019-05-05: 4000 [IU]

## 2019-05-05 MED ORDER — SERTRALINE HCL 100 MG PO TABS
100.0000 mg | ORAL_TABLET | Freq: Two times a day (BID) | ORAL | Status: DC
  Administered 2019-05-05: 14:00:00 100 mg via ORAL
  Filled 2019-05-05 (×2): qty 1

## 2019-05-05 MED ORDER — MAGNESIUM SULFATE 50 % IJ SOLN
INTRAMUSCULAR | Status: DC | PRN
  Administered 2019-05-05: 2 g via INTRAVENOUS

## 2019-05-05 MED ORDER — FENTANYL CITRATE (PF) 250 MCG/5ML IJ SOLN
INTRAMUSCULAR | Status: AC
  Filled 2019-05-05: qty 5

## 2019-05-05 MED ORDER — LIDOCAINE 2% (20 MG/ML) 5 ML SYRINGE
INTRAMUSCULAR | Status: AC
  Filled 2019-05-05: qty 5

## 2019-05-05 MED ORDER — DIPHENHYDRAMINE HCL 12.5 MG/5ML PO ELIX: 12.5000 mg | ORAL_SOLUTION | ORAL | Status: DC | PRN

## 2019-05-05 MED ORDER — CEFAZOLIN SODIUM-DEXTROSE 1-4 GM/50ML-% IV SOLN
1.0000 g | Freq: Four times a day (QID) | INTRAVENOUS | Status: DC
  Filled 2019-05-05 (×3): qty 50

## 2019-05-05 MED ORDER — LACTATED RINGERS IV SOLN
INTRAVENOUS | Status: DC | PRN
  Administered 2019-05-05 (×2): via INTRAVENOUS

## 2019-05-05 MED ORDER — ENOXAPARIN SODIUM 40 MG/0.4ML ~~LOC~~ SOLN
40.0000 mg | SUBCUTANEOUS | Status: DC
  Administered 2019-05-06: 08:00:00 40 mg via SUBCUTANEOUS
  Filled 2019-05-05: qty 0.4

## 2019-05-05 MED ORDER — METOCLOPRAMIDE HCL 5 MG/ML IJ SOLN: 5.0000 mg | Freq: Three times a day (TID) | INTRAMUSCULAR | Status: DC | PRN

## 2019-05-05 SURGICAL SUPPLY — 67 items
ALCOHOL 70% 16 OZ (MISCELLANEOUS) ×3 IMPLANT
BIT DRILL 4.8X200 CANN (BIT) ×3 IMPLANT
BIT DRILL CANN LNG FLUTE 3.0 (BIT) ×2 IMPLANT
BIT DRILL CANNULATED 3.0 (BIT) ×3
BNDG ELASTIC 6X10 VLCR STRL LF (GAUZE/BANDAGES/DRESSINGS) ×3 IMPLANT
CHLORAPREP W/TINT 26 (MISCELLANEOUS) ×9 IMPLANT
CONT SPEC 4OZ CLIKSEAL STRL BL (MISCELLANEOUS) ×3 IMPLANT
COVER SURGICAL LIGHT HANDLE (MISCELLANEOUS) ×3 IMPLANT
COVER WAND RF STERILE (DRAPES) IMPLANT
CUFF TOURN SGL QUICK 34 (TOURNIQUET CUFF) ×1
CUFF TRNQT CYL 34X4.125X (TOURNIQUET CUFF) ×2 IMPLANT
DRAPE INCISE IOBAN 66X45 STRL (DRAPES) ×3 IMPLANT
DRAPE U-SHAPE 47X51 STRL (DRAPES) ×3 IMPLANT
DRSG MEPILEX BORDER 4X4 (GAUZE/BANDAGES/DRESSINGS) ×3 IMPLANT
DRSG MEPITEL 4X7.2 (GAUZE/BANDAGES/DRESSINGS) IMPLANT
DRSG PAD ABDOMINAL 8X10 ST (GAUZE/BANDAGES/DRESSINGS) IMPLANT
DRSG XEROFORM 1X8 (GAUZE/BANDAGES/DRESSINGS) IMPLANT
ELECT REM PT RETURN 9FT ADLT (ELECTROSURGICAL) ×3
ELECTRODE REM PT RTRN 9FT ADLT (ELECTROSURGICAL) ×2 IMPLANT
GAUZE SPONGE 4X4 12PLY STRL (GAUZE/BANDAGES/DRESSINGS) ×6 IMPLANT
GAUZE SPONGE 4X4 12PLY STRL LF (GAUZE/BANDAGES/DRESSINGS) IMPLANT
GAUZE XEROFORM 5X9 LF (GAUZE/BANDAGES/DRESSINGS) ×3 IMPLANT
GLOVE BIO SURGEON STRL SZ7 (GLOVE) ×6 IMPLANT
GLOVE BIOGEL M STRL SZ7.5 (GLOVE) ×3 IMPLANT
GLOVE BIOGEL PI IND STRL 7.0 (GLOVE) ×6 IMPLANT
GLOVE BIOGEL PI IND STRL 8 (GLOVE) ×2 IMPLANT
GLOVE BIOGEL PI INDICATOR 7.0 (GLOVE) ×3
GLOVE BIOGEL PI INDICATOR 8 (GLOVE) ×1
GOWN STRL REUS W/ TWL LRG LVL3 (GOWN DISPOSABLE) ×4 IMPLANT
GOWN STRL REUS W/ TWL XL LVL3 (GOWN DISPOSABLE) ×2 IMPLANT
GOWN STRL REUS W/TWL LRG LVL3 (GOWN DISPOSABLE) ×2
GOWN STRL REUS W/TWL XL LVL3 (GOWN DISPOSABLE) ×1
GUIDEWIRE W/TRCR TIP 2.4X9.25 (WIRE) ×6 IMPLANT
KIT BASIN OR (CUSTOM PROCEDURE TRAY) ×3 IMPLANT
KIT BONE MRW ASP ANGEL CPRP (KITS) ×3 IMPLANT
KIT TURNOVER KIT B (KITS) ×3 IMPLANT
NS IRRIG 1000ML POUR BTL (IV SOLUTION) ×3 IMPLANT
PACK ORTHO EXTREMITY (CUSTOM PROCEDURE TRAY) ×3 IMPLANT
PAD ABD 8X10 STRL (GAUZE/BANDAGES/DRESSINGS) ×6 IMPLANT
PAD ARMBOARD 7.5X6 YLW CONV (MISCELLANEOUS) ×6 IMPLANT
PAD CAST 4YDX4 CTTN HI CHSV (CAST SUPPLIES) IMPLANT
PADDING CAST COTTON 4X4 STRL (CAST SUPPLIES)
PADDING CAST SYNTHETIC 4 (CAST SUPPLIES) ×1
PADDING CAST SYNTHETIC 4X4 STR (CAST SUPPLIES) ×2 IMPLANT
PIN GUIDE DRILL TIP 2.8X300 (DRILL) ×6 IMPLANT
PLATE TIBIO TALAR FUSION 44 (Plate) ×3 IMPLANT
PUTTY DBM ALLOSYNC PURE 5CC (Putty) ×3 IMPLANT
SCREW BONE TI LP 4.5X30 (Screw) ×3 IMPLANT
SCREW BONE TI LP 4.5X44 (Screw) ×3 IMPLANT
SCREW BONE TI LP 4.5X46 (Screw) ×3 IMPLANT
SCREW CANN 45X16X6.5 (Screw) ×2 IMPLANT
SCREW CANN 6.5X40 (Screw) ×3 IMPLANT
SCREW CANN 6.5X45 (Screw) ×1 IMPLANT
SPLINT PLASTER CAST XFAST 5X30 (CAST SUPPLIES) ×2 IMPLANT
SPLINT PLASTER XFAST SET 5X30 (CAST SUPPLIES) ×1
SPONGE LAP 18X18 RF (DISPOSABLE) ×6 IMPLANT
STAPLER VISISTAT 35W (STAPLE) ×3 IMPLANT
SUCTION FRAZIER HANDLE 10FR (MISCELLANEOUS) ×1
SUCTION TUBE FRAZIER 10FR DISP (MISCELLANEOUS) ×2 IMPLANT
SUT ETHILON 3 0 PS 1 (SUTURE) ×3 IMPLANT
SUT MNCRL AB 3-0 PS2 18 (SUTURE) ×9 IMPLANT
SUT PDS AB 2-0 CT1 27 (SUTURE) ×6 IMPLANT
SUT VIC AB 2-0 CT1 27 (SUTURE) ×1
SUT VIC AB 2-0 CT1 TAPERPNT 27 (SUTURE) ×2 IMPLANT
TOWEL GREEN STERILE (TOWEL DISPOSABLE) ×3 IMPLANT
TOWEL GREEN STERILE FF (TOWEL DISPOSABLE) ×3 IMPLANT
TUBE CONNECTING 12X1/4 (SUCTIONS) ×3 IMPLANT

## 2019-05-05 NOTE — Progress Notes (Signed)
PT Cancellation Note  Patient Details Name: Mike [name] MRN: [id number] DOB: 01/25/1991   Cancelled Treatment:    Reason Eval/Treat Not Completed: Patient declined, no reason specified Started session and pt receiving lunch tray upon entering. Gathered home information, however, pt requesting to eat. Checked on pt after finishing lunch and pt refusing to participate. Will follow up as schedule allows.   Leighton Ruff, PT, DPT  Acute Rehabilitation Services  Pager: [phone] Office: [phone]  Rudean Hitt 05/05/2019, 2:45 PM

## 2019-05-05 NOTE — Anesthesia Postprocedure Evaluation (Signed)
Anesthesia Post Note  Patient: Mike [name]  Procedure(s) Performed: REVISION OF RIGHT ANKLE ARTHRODESIS (Right Ankle) HARDWARE REMOVAL (Right Ankle) PERONEAL TENDON  TENODESIS (Right Ankle) Bone Marrow Biopsy (Right Hip)     Patient location during evaluation: PACU Anesthesia Type: General and Regional Level of consciousness: awake and alert Pain management: pain level controlled Vital Signs Assessment: post-procedure vital signs reviewed and stable Respiratory status: spontaneous breathing, nonlabored ventilation and respiratory function stable Cardiovascular status: blood pressure returned to baseline and stable Postop Assessment: no apparent nausea or vomiting Anesthetic complications: no    Last Vitals:  Vitals:   05/05/19 1145 05/05/19 1215  BP: 108/63 102/62  Pulse: 86 87  Resp: 13 15  Temp: (!) 36.4 C   SpO2: 97% 95%    Last Pain:  Vitals:   05/05/19 1215  TempSrc:   PainSc: Asleep                 Neville Pauls,W. EDMOND
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
## 2019-05-05 NOTE — Anesthesia Procedure Notes (Signed)
Anesthesia Regional Block: Popliteal block   Pre-Anesthetic Checklist: ,, timeout performed, Correct Patient, Correct Site, Correct Laterality, Correct Procedure, Correct Position, site marked, Risks and benefits discussed, pre-op evaluation,  At surgeon's request and post-op pain management  Laterality: Right  Prep: Maximum Sterile Barrier Precautions used, chloraprep       Needles:  Injection technique: Single-shot  Needle Type: Echogenic Stimulator Needle     Needle Length: 9cm  Needle Gauge: 21     Additional Needles:   Procedures:, nerve stimulator,,, ultrasound used (permanent image in chart),,,,   Nerve Stimulator or Paresthesia:  Response: Peroneal,  Response: Tibial,   Additional Responses:   Narrative:  Start time: 05/05/2019 7:12 AM End time: 05/05/2019 7:22 AM Injection made incrementally with aspirations every 5 mL. Anesthesiologist: Roderic Palau, MD  Additional Notes: 2% Lidocaine skin wheel. Saphenous block with 10cc of 0.5% Bupivicaine plain.

## 2019-05-05 NOTE — Anesthesia Procedure Notes (Signed)
Procedure Name: LMA Insertion Date/Time: 05/05/2019 7:36 AM Performed by: Gayland Curry, CRNA Pre-anesthesia Checklist: Patient identified, Emergency Drugs available, Suction available and Patient being monitored Patient Re-evaluated:Patient Re-evaluated prior to induction Oxygen Delivery Method: Circle system utilized Preoxygenation: Pre-oxygenation with 100% oxygen Induction Type: IV induction LMA: LMA inserted LMA Size: 4.0 Number of attempts: 1 Placement Confirmation: positive ETCO2 and breath sounds checked- equal and bilateral Dental Injury: Teeth and Oropharynx as per pre-operative assessment  Comments: Mask ventilation not attempted. Ambu Aurastraight 4.0 utilized
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
## 2019-05-05 NOTE — Transfer of Care (Signed)
Immediate Anesthesia Transfer of Care Note  Patient: Mike [name]  Procedure(s) Performed: REVISION OF RIGHT ANKLE ARTHRODESIS (Right Ankle) HARDWARE REMOVAL (Right Ankle) PERONEAL TENDON  TENODESIS (Right Ankle) Bone Marrow Biopsy (Right Hip)  Patient Location: PACU  Anesthesia Type:General  Level of Consciousness: sedated  Airway & Oxygen Therapy: Patient Spontanous Breathing and Patient connected to nasal cannula oxygen  Post-op Assessment: Report given to RN and Post -op Vital signs reviewed and stable  Post vital signs: Reviewed and stable  Last Vitals:  Vitals Value Taken Time  BP 103/58 05/05/19 1101  Temp    Pulse 96 05/05/19 1104  Resp 11 05/05/19 1104  SpO2 96 % 05/05/19 1104  Vitals shown include unvalidated device data.  Last Pain:  Vitals:   05/05/19 0610  TempSrc:   PainSc: 7       Patients Stated Pain Goal: 2 (72/62/03 [id number])  Complications: No apparent anesthesia complications

## 2019-05-05 NOTE — Discharge Instructions (Signed)
DR. Tayleigh Wetherell FOOT & ANKLE SURGERY POST-OP INSTRUCTIONS ° ° °Pain Management °1. The numbing medicine and your leg will last around 18 hours, take a dose of your pain medicine as soon as you feel it wearing off to avoid rebound pain. °2. Keep your foot elevated above heart level.  Make sure that your heel hangs free ('floats'). °3. Take all prescribed medication as directed. °4. If taking narcotic pain medication you may want to use an over-the-counter stool softener to avoid constipation. °5. You may take over-the-counter NSAIDs (ibuprofen, naproxen, etc.) as well as over-the-counter acetaminophen as directed on the packaging as a supplement for your pain and may also use it to wean away from the prescription medication. ° °Activity °? Non-weightbearing °? Postoperatively, you will be placed into a splint which stays on for 2 weeks and then will be changed at your first postop visit. ° °First Postoperative Visit °1. Your first postop visit will be at least 2 weeks after surgery.  This should be scheduled when you schedule surgery. °2. If you do not have a postoperative visit scheduled please call [phone] to schedule an appointment. °3. At the appointment your incision will be evaluated for suture removal, x-rays will be obtained if necessary. ° °General Instructions °1. Swelling is very common after foot and ankle surgery.  It often takes 3 months for the foot and ankle to begin to feel comfortable.  Some amount of swelling will persist for 6-12 months. °2. DO NOT change the dressing.  If there is a problem with the dressing (too tight, loose, gets wet, etc.) please contact Dr. Fitzpatrick Alberico's office. °3. DO NOT get the dressing wet.  For showers you can use an over-the-counter cast cover or wrap a washcloth around the top of your dressing and then cover it with a plastic bag and tape it to your leg. °4. DO NOT soak the incision (no tubs, pools, bath, etc.) until you have approval from Dr. Jwan Hornbaker. ° °Contact Dr. Adairs  office or go to Emergency Room if: °1. Temperature above 101° F. °2. Increasing pain that is unresponsive to pain medication or elevation °3. Excessive redness or swelling in your foot °4. Dressing problems - excessive bloody drainage, looseness or tightness, or if dressing gets wet °5. Develop pain, swelling, warmth, or discoloration of your calf ° °

## 2019-05-06 ENCOUNTER — Encounter (HOSPITAL_COMMUNITY): Payer: Self-pay | Admitting: General Practice

## 2019-05-06 ENCOUNTER — Other Ambulatory Visit: Payer: Self-pay

## 2019-05-06 DIAGNOSIS — M960 Pseudarthrosis after fusion or arthrodesis: Secondary | ICD-10-CM | POA: Diagnosis not present

## 2019-05-06 MED ORDER — NAPROXEN 250 MG PO TABS: 250.0000 mg | ORAL_TABLET | Freq: Two times a day (BID) | ORAL | 0 refills | Status: AC

## 2019-05-06 MED ORDER — OXYCODONE HCL 5 MG PO TABS: 5.0000 mg | ORAL_TABLET | ORAL | 0 refills | Status: DC | PRN

## 2019-05-06 MED ORDER — METHOCARBAMOL 500 MG PO TABS: 500.0000 mg | ORAL_TABLET | Freq: Four times a day (QID) | ORAL | 0 refills | Status: AC | PRN

## 2019-05-06 MED ORDER — ASPIRIN 325 MG PO TABS: 325.0000 mg | ORAL_TABLET | Freq: Every day | ORAL | 11 refills | Status: DC
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
## 2019-05-06 NOTE — Discharge Planning (Signed)
Patient discharged home in stable condition. Verbalizes understanding of all discharge instructions, including home medications and follow up appointments. 
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
## 2019-05-09 ENCOUNTER — Encounter (HOSPITAL_COMMUNITY): Payer: Self-pay | Admitting: Orthopaedic Surgery

## 2019-05-10 LAB — AEROBIC/ANAEROBIC CULTURE W GRAM STAIN (SURGICAL/DEEP WOUND): Culture: NO GROWTH

## 2019-06-20 ENCOUNTER — Ambulatory Visit: Payer: Self-pay | Admitting: Student

## 2019-06-20 DIAGNOSIS — T8484XA Pain due to internal orthopedic prosthetic devices, implants and grafts, initial encounter: Secondary | ICD-10-CM
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
## 2019-06-24 NOTE — Progress Notes (Addendum)
[facility] [address] - [place], [place] - [address]  [place] [id number] Phone: [phone] Fax: [phone]    Your procedure is scheduled on Wednesday, Septmeber 9th  [facility]tr[facility] "A" at 6:30 A.M., and check in at the Admitting office.  Call this number if you have problems the morning of surgery:  [phone]  Call [phone] if you have any questions prior to your surgery date Monday-Friday 8am-4pm   Remember:  Do not eat after midnight the night before your surgery  You may drink clear liquids until 5:30 the morning of your surgery.   Clear liquids allowed are: Water, Non-Citrus Juices (without pulp), Carbonated Beverages, Clear Tea, Black Coffee Only, and Gatorade   Please complete your PRE-SURGERY ENSURE that was provided to you by 5:30 A.M. the morning of surgery.  Please, if able, drink it in one setting. DO NOT SIP.   Take these medicines the morning of surgery with A SIP OF WATER  baclofen (LIORESAL) DULoxetine (CYMBALTA) fluticasone (FLONASE) Burnard Bunting/nasal spray gabapentin (NEURONTIN)  sertraline (ZOLOFT)   7 days prior to surgery STOP taking any Aspirin (unless otherwise instructed by your surgeon), diclofenac sodium (VOLTAREN), nabumetone (RELAFEN),Aleve, Naproxen, Ibuprofen, Motrin, Advil, Goody's, BC's, all herbal medications, fish oil, and all vitamins.  The Morning of Surgery  Do not wear jewelry, make-up or nail polish.  Do not wear lotions, powders, or perfumes/colognes, or deodorant  Do not shave 48 hours prior to surgery.  Men may shave face and neck.  Do not bring valuables to the hospital.  [facility] any belongings or valuables.  If you are a smoker, DO NOT Smoke 24 hours prior to surgery IF you wear a CPAP at night please bring your mask, tubing, and machine the morning of surgery   Remember that you must have someone to transport you home after your surgery, and remain  with you for 24 hours if you are discharged the same day.  Contacts, glasses, hearing aids, dentures or bridgework may not be worn into surgery.   Leave your suitcase in the car.  After surgery it may be brought to your room.  For patients admitted to the hospital, discharge time will be determined by your treatment team.  Patients discharged the day of surgery will not be allowed to drive home.   Special instructions:   [place] [facility]- Preparing For Surgery  Before surgery, you can play an important role. Because skin is not sterile, your skin needs to be as free of germs as possible. You can reduce the number of germs on your skin by washing with CHG (chlorahexidine gluconate) Soap before surgery.  CHG is an antiseptic cleaner which kills germs and bonds with the skin to continue killing germs even after washing.    Oral Hygiene is also important to reduce your risk of infection.  Remember - BRUSH YOUR TEETH THE MORNING OF SURGERY WITH YOUR REGULAR TOOTHPASTE  Please do not use if you have an allergy to CHG or antibacterial soaps. If your skin becomes reddened/irritated stop using the CHG.  Do not shave (including legs and underarms) for at least 48 hours prior to first CHG shower. It is OK to shave your face.  Please follow these instructions carefully.   1. Shower the NIGHT BEFORE SURGERY and the MORNING OF SURGERY with CHG Soap.   2. If you chose to wash your hair, wash your hair first as usual with your normal shampoo.  3. After you shampoo, rinse your hair and body thoroughly to remove the shampoo.  4. Use CHG as you would any other liquid soap. You can apply CHG directly to the skin and wash gently with a scrungie or a clean washcloth.   5. Apply the CHG Soap to your body ONLY FROM THE NECK DOWN.  Do not use on open wounds or open sores. Avoid contact with your eyes, ears, mouth and genitals (private parts). Wash Face and genitals (private parts)  with your normal soap.   6. Wash  thoroughly, paying special attention to the area where your surgery will be performed.  7. Thoroughly rinse your body with warm water from the neck down.  8. DO NOT shower/wash with your normal soap after using and rinsing off the CHG Soap.  9. Pat yourself dry with a CLEAN TOWEL.  10. Wear CLEAN PAJAMAS to bed the night before surgery, wear comfortable clothes the morning of surgery  11. Place CLEAN SHEETS on your bed the night of your first shower and DO NOT SLEEP WITH PETS.  Day of Surgery: Do not apply any deodorants/lotions. Please shower the morning of surgery with the CHG soap  Please wear clean clothes to the hospital/surgery center.   Remember to brush your teeth WITH YOUR REGULAR TOOTHPASTE.  Please read over the following fact sheets that you were given.

## 2019-06-28 ENCOUNTER — Other Ambulatory Visit: Payer: Self-pay

## 2019-06-28 ENCOUNTER — Encounter (HOSPITAL_COMMUNITY): Payer: Self-pay

## 2019-06-28 ENCOUNTER — Inpatient Hospital Stay (HOSPITAL_COMMUNITY)
Admission: RE | Admit: 2019-06-28 | Discharge: 2019-06-28 | Disposition: A | Discharge status: Home / Self Care | Payer: Medicaid Other | Source: Ambulatory Visit

## 2019-06-28 ENCOUNTER — Other Ambulatory Visit (HOSPITAL_COMMUNITY)
Admission: RE | Admit: 2019-06-28 | Discharge: 2019-06-28 | Disposition: A | Discharge status: Home / Self Care | Payer: Medicaid Other | Source: Ambulatory Visit | Attending: Student | Admitting: Student

## 2019-06-28 DIAGNOSIS — Z20828 Contact with and (suspected) exposure to other viral communicable diseases: Secondary | ICD-10-CM | POA: Diagnosis not present

## 2019-06-28 DIAGNOSIS — Z01812 Encounter for preprocedural laboratory examination: Secondary | ICD-10-CM | POA: Insufficient documentation

## 2019-06-28 LAB — SARS CORONAVIRUS 2 (TAT 6-24 HRS): SARS Coronavirus 2: NEGATIVE

## 2019-06-28 MED ORDER — CEFAZOLIN SODIUM-DEXTROSE 2-4 GM/100ML-% IV SOLN
2.0000 g | INTRAVENOUS | Status: AC
  Administered 2019-06-29: 2 g via INTRAVENOUS
  Filled 2019-06-28: qty 100

## 2019-06-28 NOTE — Anesthesia Preprocedure Evaluation (Addendum)
Anesthesia Evaluation  Patient identified by MRN, date of birth, ID band Patient awake    Reviewed: Allergy & Precautions, H&P , NPO status , Patient's Chart, lab work & pertinent test results  Airway Mallampati: II  TM Distance: >3 FB Neck ROM: Full    Dental no notable dental hx. (+) Teeth Intact   Pulmonary Current Smoker and Patient abstained from smoking.,    Pulmonary exam normal breath sounds clear to auscultation       Cardiovascular Exercise Tolerance: Good negative cardio ROS Normal cardiovascular exam Rhythm:Regular Rate:Normal     Neuro/Psych Anxiety Depression Bipolar Disorder negative neurological ROS     GI/Hepatic Neg liver ROS, GERD  Controlled,  Endo/Other  negative endocrine ROS  Renal/GU negative Renal ROS  negative genitourinary   Musculoskeletal  (+) Arthritis , Osteoarthritis,    Abdominal   Peds  Hematology negative hematology ROS (+)   Anesthesia Other Findings   Reproductive/Obstetrics negative OB ROS                            Anesthesia Physical Anesthesia Plan  ASA: II  Anesthesia Plan: General   Post-op Pain Management:    Induction: Intravenous  PONV Risk Score and Plan: 3 and Treatment may vary due to age or medical condition, Ondansetron, Dexamethasone and Midazolam  Airway Management Planned: LMA  Additional Equipment: None  Intra-op Plan:   Post-operative Plan:   Informed Consent: I have reviewed the patients History and Physical, chart, labs and discussed the procedure including the risks, benefits and alternatives for the proposed anesthesia with the patient or authorized representative who has indicated his/her understanding and acceptance.     Dental advisory given  Plan Discussed with: CRNA  Anesthesia Plan Comments:        Anesthesia Quick Evaluation

## 2019-06-28 NOTE — Progress Notes (Signed)
PCP - [facility] Cardiologist - denies  Chest x-ray - N/A EKG - N/A Stress Test - denies ECHO - denies Cardiac Cath - denies  Sleep Study - N/A CPAP - N/A  Blood Thinner Instructions: N/A Aspirin Instructions: N/A  Anesthesia review: No  Patient denies shortness of breath, fever, cough and chest pain.
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
## 2019-06-28 NOTE — Progress Notes (Signed)
Appointment missed. Phone call made, instructions given. Patient arrived, patient in car, instruction sheet, CHG soap, and pre-surgery Ensure drink given. Patient to go to [facility] for screening for surgery and understand the importance of self-quarantine once test completed. Patient verbalized understanding of instructions and arrival time and place for surgery tomorrow.   Per patient, note given via email from [facility], to exempt him from wearing a mask due to triggering PTSD. Patient instructed to bring note with him day of surgery. Patient agreed to wear a face shield.

## 2019-06-29 ENCOUNTER — Ambulatory Visit (HOSPITAL_COMMUNITY): Payer: Medicaid Other | Admitting: Anesthesiology

## 2019-06-29 ENCOUNTER — Ambulatory Visit (HOSPITAL_COMMUNITY): Payer: Medicaid Other

## 2019-06-29 ENCOUNTER — Encounter (HOSPITAL_COMMUNITY): Payer: Self-pay | Admitting: *Deleted

## 2019-06-29 ENCOUNTER — Ambulatory Visit (HOSPITAL_COMMUNITY)
Admission: RE | Admit: 2019-06-29 | Discharge: 2019-06-29 | Disposition: A | Discharge status: Home / Self Care | Payer: Medicaid Other | Source: Ambulatory Visit | Attending: Student | Admitting: Student

## 2019-06-29 ENCOUNTER — Encounter (HOSPITAL_COMMUNITY)
Admission: RE | Disposition: A | Discharge status: Home / Self Care | Payer: Self-pay | Source: Ambulatory Visit | Attending: Student

## 2019-06-29 DIAGNOSIS — Z79899 Other long term (current) drug therapy: Secondary | ICD-10-CM | POA: Diagnosis not present

## 2019-06-29 DIAGNOSIS — Y831 Surgical operation with implant of artificial internal device as the cause of abnormal reaction of the patient, or of later complication, without mention of misadventure at the time of the procedure: Secondary | ICD-10-CM | POA: Diagnosis not present

## 2019-06-29 DIAGNOSIS — T8484XA Pain due to internal orthopedic prosthetic devices, implants and grafts, initial encounter: Secondary | ICD-10-CM | POA: Diagnosis present

## 2019-06-29 DIAGNOSIS — M545 Low back pain: Secondary | ICD-10-CM | POA: Diagnosis not present

## 2019-06-29 DIAGNOSIS — G8929 Other chronic pain: Secondary | ICD-10-CM | POA: Diagnosis not present

## 2019-06-29 DIAGNOSIS — F319 Bipolar disorder, unspecified: Secondary | ICD-10-CM | POA: Diagnosis not present

## 2019-06-29 DIAGNOSIS — M7021 Olecranon bursitis, right elbow: Secondary | ICD-10-CM | POA: Insufficient documentation

## 2019-06-29 DIAGNOSIS — Z419 Encounter for procedure for purposes other than remedying health state, unspecified: Secondary | ICD-10-CM

## 2019-06-29 DIAGNOSIS — Z791 Long term (current) use of non-steroidal anti-inflammatories (NSAID): Secondary | ICD-10-CM | POA: Insufficient documentation

## 2019-06-29 DIAGNOSIS — T148XXA Other injury of unspecified body region, initial encounter: Secondary | ICD-10-CM

## 2019-06-29 HISTORY — PX: HARDWARE REMOVAL: SHX979

## 2019-06-29 LAB — CBC
HCT: 41.7 % (ref 39.0–52.0)
Hemoglobin: 13.7 g/dL (ref 13.0–17.0)
MCH: 31.2 pg (ref 26.0–34.0)
MCHC: 32.9 g/dL (ref 30.0–36.0)
MCV: 95 fL (ref 80.0–100.0)
Platelets: 287 10*3/uL (ref 150–400)
RBC: 4.39 MIL/uL (ref 4.22–5.81)
RDW: 13.9 % (ref 11.5–15.5)
WBC: 6.3 10*3/uL (ref 4.0–10.5)
nRBC: 0 % (ref 0.0–0.2)

## 2019-06-29 SURGERY — REMOVAL, HARDWARE
Anesthesia: General | Laterality: Right

## 2019-06-29 MED ORDER — PROPOFOL 10 MG/ML IV BOLUS
INTRAVENOUS | Status: AC
  Filled 2019-06-29: qty 20

## 2019-06-29 MED ORDER — ACETAMINOPHEN 10 MG/ML IV SOLN: 1000.0000 mg | Freq: Once | INTRAVENOUS | Status: DC | PRN

## 2019-06-29 MED ORDER — EPHEDRINE 5 MG/ML INJ
INTRAVENOUS | Status: AC
  Filled 2019-06-29: qty 10

## 2019-06-29 MED ORDER — TOBRAMYCIN SULFATE 1.2 G IJ SOLR
INTRAMUSCULAR | Status: AC
  Filled 2019-06-29: qty 1.2

## 2019-06-29 MED ORDER — 0.9 % SODIUM CHLORIDE (POUR BTL) OPTIME
TOPICAL | Status: DC | PRN
  Administered 2019-06-29: 1000 mL

## 2019-06-29 MED ORDER — PROPOFOL 10 MG/ML IV BOLUS
INTRAVENOUS | Status: DC | PRN
  Administered 2019-06-29: 200 mg via INTRAVENOUS

## 2019-06-29 MED ORDER — OXYCODONE HCL 5 MG PO TABS: 5.0000 mg | ORAL_TABLET | Freq: Four times a day (QID) | ORAL | 0 refills | Status: DC | PRN

## 2019-06-29 MED ORDER — ROCURONIUM BROMIDE 10 MG/ML (PF) SYRINGE
PREFILLED_SYRINGE | INTRAVENOUS | Status: AC
  Filled 2019-06-29: qty 10

## 2019-06-29 MED ORDER — HYDROMORPHONE HCL 1 MG/ML IJ SOLN: 0.2500 mg | INTRAMUSCULAR | Status: DC | PRN

## 2019-06-29 MED ORDER — DEXAMETHASONE SODIUM PHOSPHATE 10 MG/ML IJ SOLN
INTRAMUSCULAR | Status: AC
  Filled 2019-06-29: qty 1

## 2019-06-29 MED ORDER — CHLORHEXIDINE GLUCONATE 4 % EX LIQD: 60.0000 mL | Freq: Once | CUTANEOUS | Status: DC

## 2019-06-29 MED ORDER — EPHEDRINE SULFATE-NACL 50-0.9 MG/10ML-% IV SOSY
PREFILLED_SYRINGE | INTRAVENOUS | Status: DC | PRN
  Administered 2019-06-29: 10 mg via INTRAVENOUS
  Administered 2019-06-29: 15 mg via INTRAVENOUS
  Administered 2019-06-29: 20 mg via INTRAVENOUS
  Administered 2019-06-29: 5 mg via INTRAVENOUS

## 2019-06-29 MED ORDER — VANCOMYCIN HCL 1000 MG IV SOLR
INTRAVENOUS | Status: AC
  Filled 2019-06-29: qty 1000

## 2019-06-29 MED ORDER — ONDANSETRON HCL 4 MG/2ML IJ SOLN
INTRAMUSCULAR | Status: DC | PRN
  Administered 2019-06-29: 4 mg via INTRAVENOUS

## 2019-06-29 MED ORDER — KETAMINE HCL 50 MG/5ML IJ SOSY
PREFILLED_SYRINGE | INTRAMUSCULAR | Status: AC
  Filled 2019-06-29: qty 5

## 2019-06-29 MED ORDER — DEXAMETHASONE SODIUM PHOSPHATE 10 MG/ML IJ SOLN
INTRAMUSCULAR | Status: DC | PRN
  Administered 2019-06-29: 10 mg via INTRAVENOUS

## 2019-06-29 MED ORDER — LIDOCAINE 2% (20 MG/ML) 5 ML SYRINGE
INTRAMUSCULAR | Status: DC | PRN
  Administered 2019-06-29: 100 mg via INTRAVENOUS

## 2019-06-29 MED ORDER — ONDANSETRON HCL 4 MG/2ML IJ SOLN
INTRAMUSCULAR | Status: AC
  Filled 2019-06-29: qty 2

## 2019-06-29 MED ORDER — PROMETHAZINE HCL 25 MG/ML IJ SOLN: 6.2500 mg | INTRAMUSCULAR | Status: DC | PRN

## 2019-06-29 MED ORDER — ALBUMIN HUMAN 5 % IV SOLN
INTRAVENOUS | Status: DC | PRN
  Administered 2019-06-29: 09:00:00 via INTRAVENOUS

## 2019-06-29 MED ORDER — PHENYLEPHRINE 40 MCG/ML (10ML) SYRINGE FOR IV PUSH (FOR BLOOD PRESSURE SUPPORT)
PREFILLED_SYRINGE | INTRAVENOUS | Status: AC
  Filled 2019-06-29: qty 10

## 2019-06-29 MED ORDER — GLYCOPYRROLATE PF 0.2 MG/ML IJ SOSY
PREFILLED_SYRINGE | INTRAMUSCULAR | Status: AC
  Filled 2019-06-29: qty 1

## 2019-06-29 MED ORDER — DEXMEDETOMIDINE HCL 200 MCG/2ML IV SOLN
INTRAVENOUS | Status: DC | PRN
  Administered 2019-06-29: 8 ug via INTRAVENOUS
  Administered 2019-06-29: 16 ug via INTRAVENOUS

## 2019-06-29 MED ORDER — PHENYLEPHRINE 40 MCG/ML (10ML) SYRINGE FOR IV PUSH (FOR BLOOD PRESSURE SUPPORT)
PREFILLED_SYRINGE | INTRAVENOUS | Status: AC
  Filled 2019-06-29: qty 20

## 2019-06-29 MED ORDER — MIDAZOLAM HCL 2 MG/2ML IJ SOLN
INTRAMUSCULAR | Status: DC | PRN
  Administered 2019-06-29: 2 mg via INTRAVENOUS

## 2019-06-29 MED ORDER — LACTATED RINGERS IV SOLN
INTRAVENOUS | Status: DC | PRN
  Administered 2019-06-29: 08:00:00 via INTRAVENOUS

## 2019-06-29 MED ORDER — SUCCINYLCHOLINE CHLORIDE 200 MG/10ML IV SOSY
PREFILLED_SYRINGE | INTRAVENOUS | Status: AC
  Filled 2019-06-29: qty 10

## 2019-06-29 MED ORDER — PHENYLEPHRINE 40 MCG/ML (10ML) SYRINGE FOR IV PUSH (FOR BLOOD PRESSURE SUPPORT)
PREFILLED_SYRINGE | INTRAVENOUS | Status: DC | PRN
  Administered 2019-06-29 (×3): 120 ug via INTRAVENOUS
  Administered 2019-06-29: 80 ug via INTRAVENOUS
  Administered 2019-06-29 (×2): 120 ug via INTRAVENOUS

## 2019-06-29 MED ORDER — MEPERIDINE HCL 25 MG/ML IJ SOLN: 6.2500 mg | INTRAMUSCULAR | Status: DC | PRN

## 2019-06-29 MED ORDER — FENTANYL CITRATE (PF) 250 MCG/5ML IJ SOLN
INTRAMUSCULAR | Status: AC
  Filled 2019-06-29: qty 5

## 2019-06-29 MED ORDER — MIDAZOLAM HCL 2 MG/2ML IJ SOLN
INTRAMUSCULAR | Status: AC
  Filled 2019-06-29: qty 2

## 2019-06-29 MED ORDER — HYDROCODONE-ACETAMINOPHEN 7.5-325 MG PO TABS: 1.0000 | ORAL_TABLET | Freq: Once | ORAL | Status: DC | PRN

## 2019-06-29 SURGICAL SUPPLY — 63 items
BANDAGE ESMARK 6X9 LF (GAUZE/BANDAGES/DRESSINGS) ×1 IMPLANT
BNDG COHESIVE 4X5 WHT NS (GAUZE/BANDAGES/DRESSINGS) ×2 IMPLANT
BNDG COHESIVE 6X5 TAN STRL LF (GAUZE/BANDAGES/DRESSINGS) ×2 IMPLANT
BNDG ELASTIC 4X5.8 VLCR STR LF (GAUZE/BANDAGES/DRESSINGS) ×2 IMPLANT
BNDG ELASTIC 6X5.8 VLCR STR LF (GAUZE/BANDAGES/DRESSINGS) ×2 IMPLANT
BNDG ESMARK 6X9 LF (GAUZE/BANDAGES/DRESSINGS) ×2
BNDG GAUZE ELAST 4 BULKY (GAUZE/BANDAGES/DRESSINGS) ×4 IMPLANT
BRUSH SCRUB EZ PLAIN DRY (MISCELLANEOUS) ×4 IMPLANT
CHLORAPREP W/TINT 26 (MISCELLANEOUS) ×2 IMPLANT
COVER SURGICAL LIGHT HANDLE (MISCELLANEOUS) ×4 IMPLANT
COVER WAND RF STERILE (DRAPES) ×2 IMPLANT
CUFF TOURN SGL QUICK 18X4 (TOURNIQUET CUFF) IMPLANT
CUFF TOURN SGL QUICK 24 (TOURNIQUET CUFF)
CUFF TOURN SGL QUICK 34 (TOURNIQUET CUFF)
CUFF TRNQT CYL 24X4X16.5-23 (TOURNIQUET CUFF) IMPLANT
CUFF TRNQT CYL 34X4.125X (TOURNIQUET CUFF) IMPLANT
DRAPE C-ARM 42X72 X-RAY (DRAPES) IMPLANT
DRAPE C-ARMOR (DRAPES) ×2 IMPLANT
DRAPE U-SHAPE 47X51 STRL (DRAPES) ×2 IMPLANT
DRSG ADAPTIC 3X8 NADH LF (GAUZE/BANDAGES/DRESSINGS) ×2 IMPLANT
ELECT REM PT RETURN 9FT ADLT (ELECTROSURGICAL) ×2
ELECTRODE REM PT RTRN 9FT ADLT (ELECTROSURGICAL) ×1 IMPLANT
GAUZE SPONGE 4X4 12PLY STRL (GAUZE/BANDAGES/DRESSINGS) ×2 IMPLANT
GAUZE SPONGE 4X4 16PLY XRAY LF (GAUZE/BANDAGES/DRESSINGS) ×2 IMPLANT
GLOVE BIO SURGEON STRL SZ 6.5 (GLOVE) ×6 IMPLANT
GLOVE BIO SURGEON STRL SZ7.5 (GLOVE) ×8 IMPLANT
GLOVE BIOGEL PI IND STRL 6.5 (GLOVE) ×1 IMPLANT
GLOVE BIOGEL PI IND STRL 7.5 (GLOVE) ×1 IMPLANT
GLOVE BIOGEL PI INDICATOR 6.5 (GLOVE) ×1
GLOVE BIOGEL PI INDICATOR 7.5 (GLOVE) ×1
GOWN STRL REUS W/ TWL LRG LVL3 (GOWN DISPOSABLE) ×2 IMPLANT
GOWN STRL REUS W/TWL LRG LVL3 (GOWN DISPOSABLE) ×2
KIT BASIN OR (CUSTOM PROCEDURE TRAY) ×2 IMPLANT
KIT TURNOVER KIT B (KITS) ×2 IMPLANT
MANIFOLD NEPTUNE II (INSTRUMENTS) ×2 IMPLANT
NEEDLE 22X1 1/2 (OR ONLY) (NEEDLE) IMPLANT
NS IRRIG 1000ML POUR BTL (IV SOLUTION) ×2 IMPLANT
PACK ORTHO EXTREMITY (CUSTOM PROCEDURE TRAY) ×2 IMPLANT
PAD ARMBOARD 7.5X6 YLW CONV (MISCELLANEOUS) ×4 IMPLANT
PAD CAST 4YDX4 CTTN HI CHSV (CAST SUPPLIES) ×1 IMPLANT
PADDING CAST COTTON 4X4 STRL (CAST SUPPLIES) ×1
PADDING CAST COTTON 6X4 STRL (CAST SUPPLIES) ×6 IMPLANT
SPONGE LAP 18X18 RF (DISPOSABLE) ×2 IMPLANT
STAPLER VISISTAT 35W (STAPLE) IMPLANT
STOCKINETTE IMPERVIOUS LG (DRAPES) ×2 IMPLANT
STRIP CLOSURE SKIN 1/2X4 (GAUZE/BANDAGES/DRESSINGS) ×2 IMPLANT
SUCTION FRAZIER HANDLE 10FR (MISCELLANEOUS)
SUCTION TUBE FRAZIER 10FR DISP (MISCELLANEOUS) IMPLANT
SUT ETHILON 3 0 PS 1 (SUTURE) IMPLANT
SUT MNCRL AB 3-0 PS2 18 (SUTURE) ×2 IMPLANT
SUT MON AB 2-0 CT1 36 (SUTURE) ×2 IMPLANT
SUT PDS AB 2-0 CT1 27 (SUTURE) IMPLANT
SUT VIC AB 0 CT1 27 (SUTURE)
SUT VIC AB 0 CT1 27XBRD ANBCTR (SUTURE) IMPLANT
SUT VIC AB 2-0 CT1 27 (SUTURE)
SUT VIC AB 2-0 CT1 TAPERPNT 27 (SUTURE) IMPLANT
SYR CONTROL 10ML LL (SYRINGE) IMPLANT
TOWEL GREEN STERILE (TOWEL DISPOSABLE) ×4 IMPLANT
TOWEL GREEN STERILE FF (TOWEL DISPOSABLE) ×4 IMPLANT
TUBE CONNECTING 12X1/4 (SUCTIONS) ×2 IMPLANT
UNDERPAD 30X30 (UNDERPADS AND DIAPERS) ×2 IMPLANT
WATER STERILE IRR 1000ML POUR (IV SOLUTION) ×4 IMPLANT
YANKAUER SUCT BULB TIP NO VENT (SUCTIONS) ×2 IMPLANT

## 2019-06-29 NOTE — Interval H&P Note (Signed)
History and Physical Interval Note:  06/29/2019 8:03 AM  Mike [name]  has presented today for surgery, with the diagnosis of Painful orthopaedic hardware.  The various methods of treatment have been discussed with the patient and family. After consideration of risks, benefits and other options for treatment, the patient has consented to  Procedure(s): HARDWARE REMOVAL RIGHT ELBOW (Right) as a surgical intervention.  The patient's history has been reviewed, patient examined, no change in status, stable for surgery.  I have reviewed the patient's chart and labs.  Questions were answered to the patient's satisfaction.     Lennette Bihari P Haddix

## 2019-06-29 NOTE — Anesthesia Procedure Notes (Signed)
Procedure Name: LMA Insertion Date/Time: 06/29/2019 8:37 AM Performed by: Marsa Aris, CRNA Pre-anesthesia Checklist: Patient identified, Emergency Drugs available, Suction available and Patient being monitored Patient Re-evaluated:Patient Re-evaluated prior to induction Oxygen Delivery Method: Circle System Utilized Preoxygenation: Pre-oxygenation with 100% oxygen Induction Type: IV induction Ventilation: Mask ventilation without difficulty LMA: LMA inserted LMA Size: 4.0 Number of attempts: 1 Airway Equipment and Method: Bite block Placement Confirmation: positive ETCO2 Tube secured with: Tape Dental Injury: Teeth and Oropharynx as per pre-operative assessment
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
## 2019-06-29 NOTE — Progress Notes (Signed)
MD and anesthesia made aware of patient requesting to keep his hardware upon removal. MD stated he would let the OR staff know as well. Patient signed consent stating he understood the conditions of receiving his hardware.

## 2019-06-29 NOTE — Transfer of Care (Signed)
Immediate Anesthesia Transfer of Care Note  Patient: Mike [name]  Procedure(s) Performed: HARDWARE REMOVAL RIGHT ELBOW (Right )  Patient Location: PACU  Anesthesia Type:General  Level of Consciousness: oriented, drowsy and patient cooperative  Airway & Oxygen Therapy: Patient Spontanous Breathing and Patient connected to face mask oxygen  Post-op Assessment: Report given to RN and Post -op Vital signs reviewed and stable  Post vital signs: Reviewed and stable  Last Vitals:  Vitals Value Taken Time  BP 109/64 06/29/19 0950  Temp 36.6 C 06/29/19 0950  Pulse 93 06/29/19 0950  Resp 11 06/29/19 0950  SpO2 100 % 06/29/19 0950  Vitals shown include unvalidated device data.  Last Pain:  Vitals:   06/29/19 0744  PainSc: 7          Complications: No apparent anesthesia complications

## 2019-06-29 NOTE — Discharge Instructions (Addendum)
Orthopaedic Trauma Service Discharge Instructions   General Discharge Instructions  WEIGHT BEARING STATUS:weightbearing as tolerated right arm  RANGE OF MOTION/ACTIVITY: Unrestricted range of motion of elbow  Wound Care: Okay to remove surgical dressing on post-op day #2 (Friday 07/01/19). Leave steri-strips in place. Incisions can be left open to air if there is no drainage. If incision continues to have drainage, follow wound care instructions below. Okay to shower and get steri-strips wet.   DVT/PE prophylaxis: None  Diet: as you were eating previously.  Can use over the counter stool softeners and bowel preparations, such as Miralax, to help with bowel movements.  Narcotics can be constipating.  Be sure to drink plenty of fluids  PAIN MEDICATION USE AND EXPECTATIONS  You have likely been given narcotic medications to help control your pain.  After a traumatic event that results in an fracture (broken bone) with or without surgery, it is ok to use narcotic pain medications to help control one's pain.  We understand that everyone responds to pain differently and each individual patient will be evaluated on a regular basis for the continued need for narcotic medications. Ideally, narcotic medication use should last no more than 6-8 weeks (coinciding with fracture healing).   As a patient it is your responsibility as well to monitor narcotic medication use and report the amount and frequency you use these medications when you come to your office visit.   We would also advise that if you are using narcotic medications, you should take a dose prior to therapy to maximize you participation.  IF YOU ARE ON NARCOTIC MEDICATIONS IT IS NOT PERMISSIBLE TO OPERATE A MOTOR VEHICLE (MOTORCYCLE/CAR/TRUCK/MOPED) OR HEAVY MACHINERY DO NOT MIX NARCOTICS WITH OTHER CNS (CENTRAL NERVOUS SYSTEM) DEPRESSANTS SUCH AS ALCOHOL   STOP SMOKING OR USING NICOTINE PRODUCTS!!!!  As discussed nicotine severely  impairs your body's ability to heal surgical and traumatic wounds but also impairs bone healing.  Wounds and bone heal by forming microscopic blood vessels (angiogenesis) and nicotine is a vasoconstrictor (essentially, shrinks blood vessels).  Therefore, if vasoconstriction occurs to these microscopic blood vessels they essentially disappear and are unable to deliver necessary nutrients to the healing tissue.  This is one modifiable factor that you can do to dramatically increase your chances of healing your injury.    (This means no smoking, no nicotine gum, patches, etc)  DO NOT USE NONSTEROIDAL ANTI-INFLAMMATORY DRUGS (NSAID'S)  Using products such as Advil (ibuprofen), Aleve (naproxen), Motrin (ibuprofen) for additional pain control during fracture healing can delay and/or prevent the healing response.  If you would like to take over the counter (OTC) medication, Tylenol (acetaminophen) is ok.  However, some narcotic medications that are given for pain control contain acetaminophen as well. Therefore, you should not exceed more than 4000 mg of tylenol in a day if you do not have liver disease.  Also note that there are may OTC medicines, such as cold medicines and allergy medicines that my contain tylenol as well.  If you have any questions about medications and/or interactions please ask your doctor/PA or your pharmacist.      ICE AND ELEVATE INJURED/OPERATIVE EXTREMITY  Using ice and elevating the injured extremity above your heart can help with swelling and pain control.  Icing in a pulsatile fashion, such as 20 minutes on and 20 minutes off, can be followed.    Do not place ice directly on skin. Make sure there is a barrier between to skin and the ice pack.  Using frozen items such as frozen peas works well as the conform nicely to the are that needs to be iced.  USE AN ACE WRAP OR TED HOSE FOR SWELLING CONTROL  In addition to icing and elevation, Ace wraps or TED hose are used to help limit  and resolve swelling.  It is recommended to use Ace wraps or TED hose until you are informed to stop.    When using Ace Wraps start the wrapping distally (farthest away from the body) and wrap proximally (closer to the body)   Example: If you had surgery on your leg or thing and you do not have a splint on, start the ace wrap at the toes and work your way up to the thigh        If you had surgery on your upper extremity and do not have a splint on, start the ace wrap at your fingers and work your way up to the upper arm   [place]: [phone]   VISIT OUR WEBSITE FOR ADDITIONAL INFORMATION: [email or web address]     Discharge Wound Care Instructions  Do NOT apply any ointments, solutions or lotions to pin sites or surgical wounds.  These prevent needed drainage and even though solutions like hydrogen peroxide kill bacteria, they also damage cells lining the pin sites that help fight infection.  Applying lotions or ointments can keep the wounds moist and can cause them to breakdown and open up as well. This can increase the risk for infection. When in doubt call the office.  Surgical incisions should be dressed daily.  If any drainage is noted, use one layer of adaptic, then gauze, Kerlix, and an ace wrap.  Once the incision is completely dry and without drainage, it may be left open to air out.  Showering may begin 36-48 hours later.  Cleaning gently with soap and water.  Traumatic wounds should be dressed daily as well.    One layer of adaptic, gauze, Kerlix, then ace wrap.  The adaptic can be discontinued once the draining has ceased    If you have a wet to dry dressing: wet the gauze with saline the squeeze as much saline out so the gauze is moist (not soaking wet), place moistened gauze over wound, then place a dry gauze over the moist one, followed by Kerlix wrap, then ace wrap.

## 2019-06-29 NOTE — Anesthesia Postprocedure Evaluation (Signed)
Anesthesia Post Note  Patient: Mike [name]  Procedure(s) Performed: HARDWARE REMOVAL RIGHT ELBOW (Right )     Patient location during evaluation: PACU Anesthesia Type: General Level of consciousness: awake and alert Pain management: pain level controlled Vital Signs Assessment: post-procedure vital signs reviewed and stable Respiratory status: spontaneous breathing, nonlabored ventilation, respiratory function stable and patient connected to nasal cannula oxygen Cardiovascular status: blood pressure returned to baseline and stable Postop Assessment: no apparent nausea or vomiting Anesthetic complications: no    Last Vitals:  Vitals:   06/29/19 1050 06/29/19 1105  BP: (!) 115/56 107/67  Pulse: (!) 102 (!) 101  Resp: 12 11  Temp:  36.6 C  SpO2: 96% 95%    Last Pain:  Vitals:   06/29/19 1050  PainSc: 0-No pain                 Barnet Glasgow

## 2019-06-30 ENCOUNTER — Encounter (HOSPITAL_COMMUNITY): Payer: Self-pay | Admitting: Student

## 2019-10-12 ENCOUNTER — Other Ambulatory Visit: Payer: Self-pay | Admitting: Orthopaedic Surgery

## 2019-10-12 DIAGNOSIS — M79672 Pain in left foot: Secondary | ICD-10-CM

## 2019-10-28 ENCOUNTER — Encounter (INDEPENDENT_AMBULATORY_CARE_PROVIDER_SITE_OTHER): Payer: Self-pay

## 2019-10-28 ENCOUNTER — Ambulatory Visit
Admission: RE | Admit: 2019-10-28 | Discharge: 2019-10-28 | Disposition: A | Discharge status: Home / Self Care | Payer: Medicaid Other | Source: Ambulatory Visit | Attending: Orthopaedic Surgery | Admitting: Orthopaedic Surgery

## 2019-10-28 DIAGNOSIS — M79672 Pain in left foot: Secondary | ICD-10-CM

## 2020-01-24 ENCOUNTER — Encounter (HOSPITAL_BASED_OUTPATIENT_CLINIC_OR_DEPARTMENT_OTHER): Payer: Self-pay | Admitting: Orthopaedic Surgery

## 2020-01-24 ENCOUNTER — Other Ambulatory Visit: Payer: Self-pay

## 2020-01-24 ENCOUNTER — Other Ambulatory Visit: Payer: Self-pay | Admitting: Orthopaedic Surgery

## 2020-01-27 ENCOUNTER — Other Ambulatory Visit (HOSPITAL_COMMUNITY)
Admission: RE | Admit: 2020-01-27 | Discharge: 2020-01-27 | Disposition: A | Discharge status: Home / Self Care | Payer: Medicaid Other | Source: Ambulatory Visit | Attending: Orthopaedic Surgery | Admitting: Orthopaedic Surgery

## 2020-01-27 DIAGNOSIS — Z01812 Encounter for preprocedural laboratory examination: Secondary | ICD-10-CM | POA: Insufficient documentation

## 2020-01-27 DIAGNOSIS — Z20822 Contact with and (suspected) exposure to covid-19: Secondary | ICD-10-CM | POA: Insufficient documentation

## 2020-01-27 LAB — SARS CORONAVIRUS 2 (TAT 6-24 HRS): SARS Coronavirus 2: NEGATIVE

## 2020-01-31 ENCOUNTER — Other Ambulatory Visit: Payer: Self-pay

## 2020-01-31 ENCOUNTER — Ambulatory Visit (HOSPITAL_BASED_OUTPATIENT_CLINIC_OR_DEPARTMENT_OTHER): Payer: Medicaid Other | Admitting: Anesthesiology

## 2020-01-31 ENCOUNTER — Ambulatory Visit (HOSPITAL_BASED_OUTPATIENT_CLINIC_OR_DEPARTMENT_OTHER)
Admission: RE | Admit: 2020-01-31 | Discharge: 2020-01-31 | Disposition: A | Discharge status: Home / Self Care | Payer: Medicaid Other | Source: Ambulatory Visit | Attending: Orthopaedic Surgery | Admitting: Orthopaedic Surgery

## 2020-01-31 ENCOUNTER — Encounter (HOSPITAL_BASED_OUTPATIENT_CLINIC_OR_DEPARTMENT_OTHER): Payer: Self-pay | Admitting: Orthopaedic Surgery

## 2020-01-31 ENCOUNTER — Encounter (HOSPITAL_BASED_OUTPATIENT_CLINIC_OR_DEPARTMENT_OTHER)
Admission: RE | Disposition: A | Discharge status: Home / Self Care | Payer: Self-pay | Source: Ambulatory Visit | Attending: Orthopaedic Surgery

## 2020-01-31 DIAGNOSIS — F17200 Nicotine dependence, unspecified, uncomplicated: Secondary | ICD-10-CM | POA: Diagnosis not present

## 2020-01-31 DIAGNOSIS — M898X7 Other specified disorders of bone, ankle and foot: Secondary | ICD-10-CM | POA: Insufficient documentation

## 2020-01-31 DIAGNOSIS — Z79899 Other long term (current) drug therapy: Secondary | ICD-10-CM | POA: Diagnosis not present

## 2020-01-31 DIAGNOSIS — F319 Bipolar disorder, unspecified: Secondary | ICD-10-CM | POA: Diagnosis not present

## 2020-01-31 DIAGNOSIS — F4310 Post-traumatic stress disorder, unspecified: Secondary | ICD-10-CM | POA: Insufficient documentation

## 2020-01-31 DIAGNOSIS — F419 Anxiety disorder, unspecified: Secondary | ICD-10-CM | POA: Insufficient documentation

## 2020-01-31 DIAGNOSIS — T849XXA Unspecified complication of internal orthopedic prosthetic device, implant and graft, initial encounter: Secondary | ICD-10-CM | POA: Insufficient documentation

## 2020-01-31 DIAGNOSIS — F17210 Nicotine dependence, cigarettes, uncomplicated: Secondary | ICD-10-CM | POA: Insufficient documentation

## 2020-01-31 DIAGNOSIS — Y831 Surgical operation with implant of artificial internal device as the cause of abnormal reaction of the patient, or of later complication, without mention of misadventure at the time of the procedure: Secondary | ICD-10-CM | POA: Insufficient documentation

## 2020-01-31 HISTORY — PX: HARDWARE REMOVAL: SHX979

## 2020-01-31 SURGERY — REMOVAL, HARDWARE
Anesthesia: General | Site: Foot | Laterality: Left

## 2020-01-31 MED ORDER — OXYCODONE HCL 5 MG/5ML PO SOLN: 5.0000 mg | Freq: Once | ORAL | Status: AC | PRN

## 2020-01-31 MED ORDER — PROPOFOL 500 MG/50ML IV EMUL
INTRAVENOUS | Status: AC
  Filled 2020-01-31: qty 50

## 2020-01-31 MED ORDER — HYDROMORPHONE HCL 1 MG/ML IJ SOLN
INTRAMUSCULAR | Status: AC
  Filled 2020-01-31: qty 0.5

## 2020-01-31 MED ORDER — DEXMEDETOMIDINE HCL 200 MCG/2ML IV SOLN
INTRAVENOUS | Status: DC | PRN
  Administered 2020-01-31: 20 ug via INTRAVENOUS

## 2020-01-31 MED ORDER — LACTATED RINGERS IV SOLN: INTRAVENOUS | Status: DC

## 2020-01-31 MED ORDER — HYDROCODONE-ACETAMINOPHEN 5-325 MG PO TABS: 1.0000 | ORAL_TABLET | ORAL | 0 refills | Status: DC | PRN

## 2020-01-31 MED ORDER — CEFAZOLIN SODIUM-DEXTROSE 2-4 GM/100ML-% IV SOLN
INTRAVENOUS | Status: AC
  Filled 2020-01-31: qty 100

## 2020-01-31 MED ORDER — MIDAZOLAM HCL 2 MG/2ML IJ SOLN
1.0000 mg | INTRAMUSCULAR | Status: DC | PRN
  Administered 2020-01-31: 2 mg via INTRAVENOUS

## 2020-01-31 MED ORDER — HYDROMORPHONE HCL 1 MG/ML IJ SOLN
0.2500 mg | INTRAMUSCULAR | Status: DC | PRN
  Administered 2020-01-31: 0.5 mg via INTRAVENOUS

## 2020-01-31 MED ORDER — EPHEDRINE 5 MG/ML INJ
INTRAVENOUS | Status: AC
  Filled 2020-01-31: qty 10

## 2020-01-31 MED ORDER — FENTANYL CITRATE (PF) 100 MCG/2ML IJ SOLN
INTRAMUSCULAR | Status: AC
  Filled 2020-01-31: qty 2

## 2020-01-31 MED ORDER — DEXAMETHASONE SODIUM PHOSPHATE 10 MG/ML IJ SOLN
INTRAMUSCULAR | Status: AC
  Filled 2020-01-31: qty 1

## 2020-01-31 MED ORDER — BUPIVACAINE HCL 0.5 % IJ SOLN
INTRAMUSCULAR | Status: DC | PRN
  Administered 2020-01-31: 10 mL

## 2020-01-31 MED ORDER — MIDAZOLAM HCL 2 MG/2ML IJ SOLN
INTRAMUSCULAR | Status: AC
  Filled 2020-01-31: qty 2

## 2020-01-31 MED ORDER — PROMETHAZINE HCL 25 MG/ML IJ SOLN: 6.2500 mg | INTRAMUSCULAR | Status: DC | PRN

## 2020-01-31 MED ORDER — SUCCINYLCHOLINE CHLORIDE 200 MG/10ML IV SOSY
PREFILLED_SYRINGE | INTRAVENOUS | Status: AC
  Filled 2020-01-31: qty 10

## 2020-01-31 MED ORDER — FENTANYL CITRATE (PF) 100 MCG/2ML IJ SOLN
50.0000 ug | INTRAMUSCULAR | Status: AC | PRN
  Administered 2020-01-31: 50 ug via INTRAVENOUS
  Administered 2020-01-31: 100 ug via INTRAVENOUS
  Administered 2020-01-31: 50 ug via INTRAVENOUS

## 2020-01-31 MED ORDER — OXYCODONE HCL 5 MG PO TABS
ORAL_TABLET | ORAL | Status: AC
  Filled 2020-01-31: qty 1

## 2020-01-31 MED ORDER — CEFAZOLIN SODIUM-DEXTROSE 2-4 GM/100ML-% IV SOLN
2.0000 g | INTRAVENOUS | Status: AC
  Administered 2020-01-31: 2 g via INTRAVENOUS

## 2020-01-31 MED ORDER — DIPHENHYDRAMINE HCL 50 MG/ML IJ SOLN
INTRAMUSCULAR | Status: AC
  Filled 2020-01-31: qty 1

## 2020-01-31 MED ORDER — POVIDONE-IODINE 10 % EX SWAB
2.0000 "application " | Freq: Once | CUTANEOUS | Status: AC
  Administered 2020-01-31: 2 via TOPICAL

## 2020-01-31 MED ORDER — DEXMEDETOMIDINE HCL IN NACL 200 MCG/50ML IV SOLN
INTRAVENOUS | Status: AC
  Filled 2020-01-31: qty 50

## 2020-01-31 MED ORDER — PROPOFOL 10 MG/ML IV BOLUS
INTRAVENOUS | Status: DC | PRN
  Administered 2020-01-31: 200 mg via INTRAVENOUS

## 2020-01-31 MED ORDER — ONDANSETRON HCL 4 MG/2ML IJ SOLN
INTRAMUSCULAR | Status: DC | PRN
  Administered 2020-01-31: 4 mg via INTRAVENOUS

## 2020-01-31 MED ORDER — PHENYLEPHRINE 40 MCG/ML (10ML) SYRINGE FOR IV PUSH (FOR BLOOD PRESSURE SUPPORT)
PREFILLED_SYRINGE | INTRAVENOUS | Status: AC
  Filled 2020-01-31: qty 10

## 2020-01-31 MED ORDER — BUPIVACAINE HCL (PF) 0.5 % IJ SOLN
INTRAMUSCULAR | Status: AC
  Filled 2020-01-31: qty 30

## 2020-01-31 MED ORDER — OXYCODONE HCL 5 MG PO TABS
5.0000 mg | ORAL_TABLET | Freq: Once | ORAL | Status: AC | PRN
  Administered 2020-01-31: 5 mg via ORAL

## 2020-01-31 MED ORDER — DIPHENHYDRAMINE HCL 50 MG/ML IJ SOLN
INTRAMUSCULAR | Status: DC | PRN
  Administered 2020-01-31: 6.25 mg via INTRAVENOUS

## 2020-01-31 MED ORDER — MEPERIDINE HCL 25 MG/ML IJ SOLN: 6.2500 mg | INTRAMUSCULAR | Status: DC | PRN

## 2020-01-31 MED ORDER — LIDOCAINE 2% (20 MG/ML) 5 ML SYRINGE
INTRAMUSCULAR | Status: AC
  Filled 2020-01-31: qty 5

## 2020-01-31 MED ORDER — ONDANSETRON HCL 4 MG/2ML IJ SOLN
INTRAMUSCULAR | Status: AC
  Filled 2020-01-31: qty 2

## 2020-01-31 MED ORDER — DEXAMETHASONE SODIUM PHOSPHATE 10 MG/ML IJ SOLN
INTRAMUSCULAR | Status: DC | PRN
  Administered 2020-01-31: 10 mg via INTRAVENOUS

## 2020-01-31 MED ORDER — LIDOCAINE 2% (20 MG/ML) 5 ML SYRINGE
INTRAMUSCULAR | Status: DC | PRN
  Administered 2020-01-31: 80 mg via INTRAVENOUS

## 2020-01-31 SURGICAL SUPPLY — 60 items
BANDAGE ESMARK 6X9 LF (GAUZE/BANDAGES/DRESSINGS) IMPLANT
BENZOIN TINCTURE PRP APPL 2/3 (GAUZE/BANDAGES/DRESSINGS) IMPLANT
BLADE SURG 15 STRL LF DISP TIS (BLADE) ×2 IMPLANT
BLADE SURG 15 STRL SS (BLADE) ×4
BNDG ELASTIC 4X5.8 VLCR STR LF (GAUZE/BANDAGES/DRESSINGS) ×1 IMPLANT
BNDG ELASTIC 6X5.8 VLCR STR LF (GAUZE/BANDAGES/DRESSINGS) IMPLANT
BNDG ESMARK 4X9 LF (GAUZE/BANDAGES/DRESSINGS) IMPLANT
BNDG ESMARK 6X9 LF (GAUZE/BANDAGES/DRESSINGS)
BOOT STEPPER DURA MED (SOFTGOODS) ×1 IMPLANT
CHLORAPREP W/TINT 26 (MISCELLANEOUS) ×2 IMPLANT
COVER BACK TABLE 60X90IN (DRAPES) ×2 IMPLANT
COVER WAND RF STERILE (DRAPES) IMPLANT
CUFF TOURN SGL QUICK 34 (TOURNIQUET CUFF) ×1
CUFF TRNQT CYL 34X4.125X (TOURNIQUET CUFF) IMPLANT
DECANTER SPIKE VIAL GLASS SM (MISCELLANEOUS) IMPLANT
DRAPE C-ARM 42X72 X-RAY (DRAPES) IMPLANT
DRAPE EXTREMITY T 121X128X90 (DISPOSABLE) ×2 IMPLANT
DRAPE IMP U-DRAPE 54X76 (DRAPES) ×2 IMPLANT
DRAPE OEC MINIVIEW 54X84 (DRAPES) ×2 IMPLANT
DRAPE U-SHAPE 47X51 STRL (DRAPES) ×2 IMPLANT
DRSG PAD ABDOMINAL 8X10 ST (GAUZE/BANDAGES/DRESSINGS) IMPLANT
ELECT REM PT RETURN 9FT ADLT (ELECTROSURGICAL) ×2
ELECTRODE REM PT RTRN 9FT ADLT (ELECTROSURGICAL) ×1 IMPLANT
GAUZE SPONGE 4X4 12PLY STRL (GAUZE/BANDAGES/DRESSINGS) ×2 IMPLANT
GAUZE XEROFORM 1X8 LF (GAUZE/BANDAGES/DRESSINGS) ×2 IMPLANT
GLOVE BIOGEL M STRL SZ7.5 (GLOVE) ×5 IMPLANT
GLOVE BIOGEL PI IND STRL 8 (GLOVE) ×1 IMPLANT
GLOVE BIOGEL PI INDICATOR 8 (GLOVE) ×4
GOWN STRL REUS W/ TWL LRG LVL3 (GOWN DISPOSABLE) ×1 IMPLANT
GOWN STRL REUS W/ TWL XL LVL3 (GOWN DISPOSABLE) ×1 IMPLANT
GOWN STRL REUS W/TWL LRG LVL3 (GOWN DISPOSABLE) ×2
GOWN STRL REUS W/TWL XL LVL3 (GOWN DISPOSABLE) ×1
NDL HYPO 25X1 1.5 SAFETY (NEEDLE) IMPLANT
NEEDLE HYPO 25X1 1.5 SAFETY (NEEDLE) IMPLANT
NS IRRIG 1000ML POUR BTL (IV SOLUTION) ×2 IMPLANT
PACK BASIN DAY SURGERY FS (CUSTOM PROCEDURE TRAY) ×2 IMPLANT
PAD CAST 4YDX4 CTTN HI CHSV (CAST SUPPLIES) ×1 IMPLANT
PADDING CAST COTTON 4X4 STRL (CAST SUPPLIES) ×1
PADDING CAST SYNTHETIC 4 (CAST SUPPLIES)
PADDING CAST SYNTHETIC 4X4 STR (CAST SUPPLIES) IMPLANT
PENCIL SMOKE EVACUATOR (MISCELLANEOUS) ×2 IMPLANT
SHEET MEDIUM DRAPE 40X70 STRL (DRAPES) ×2 IMPLANT
SLEEVE SCD COMPRESS KNEE MED (MISCELLANEOUS) ×2 IMPLANT
SPLINT FAST PLASTER 5X30 (CAST SUPPLIES)
SPLINT PLASTER CAST FAST 5X30 (CAST SUPPLIES) IMPLANT
SPONGE LAP 18X18 RF (DISPOSABLE) IMPLANT
STOCKINETTE 6  STRL (DRAPES) ×1
STOCKINETTE 6 STRL (DRAPES) ×1 IMPLANT
STRIP CLOSURE SKIN 1/2X4 (GAUZE/BANDAGES/DRESSINGS) IMPLANT
SUCTION FRAZIER HANDLE 10FR (MISCELLANEOUS) ×2
SUCTION TUBE FRAZIER 10FR DISP (MISCELLANEOUS) IMPLANT
SUT ETHILON 3 0 PS 1 (SUTURE) ×2 IMPLANT
SUT MNCRL AB 3-0 PS2 18 (SUTURE) ×2 IMPLANT
SUT PDS AB 2-0 CT2 27 (SUTURE) ×2 IMPLANT
SUT VIC AB 3-0 FS2 27 (SUTURE) IMPLANT
SYR BULB 3OZ (MISCELLANEOUS) ×2 IMPLANT
SYR CONTROL 10ML LL (SYRINGE) IMPLANT
TOWEL GREEN STERILE FF (TOWEL DISPOSABLE) ×4 IMPLANT
TUBE CONNECTING 20X1/4 (TUBING) ×1 IMPLANT
UNDERPAD 30X36 HEAVY ABSORB (UNDERPADS AND DIAPERS) ×2 IMPLANT
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
## 2020-01-31 NOTE — Anesthesia Procedure Notes (Signed)
Procedure Name: LMA Insertion Date/Time: 01/31/2020 7:34 AM Performed by: Ronnette Hila, CRNA Pre-anesthesia Checklist: Patient identified, Emergency Drugs available, Suction available and Patient being monitored Patient Re-evaluated:Patient Re-evaluated prior to induction Oxygen Delivery Method: Circle system utilized Preoxygenation: Pre-oxygenation with 100% oxygen Induction Type: IV induction Ventilation: Mask ventilation without difficulty LMA: LMA inserted LMA Size: 4.0 Number of attempts: 1 Airway Equipment and Method: Bite block Placement Confirmation: positive ETCO2 Tube secured with: Tape Dental Injury: Teeth and Oropharynx as per pre-operative assessment

## 2020-01-31 NOTE — Discharge Instructions (Signed)
Oxycodone given at 9:45 am Norco can be give at 1:45pm if needed.  DR. Susa Simmonds FOOT & ANKLE SURGERY POST-OP INSTRUCTIONS   Pain Management 1. The numbing medicine and your leg will last around 18 hours, take a dose of your pain medicine as soon as you feel it wearing off to avoid rebound pain. 2. Keep your foot elevated above heart level.  Make sure that your heel hangs free ('floats'). 3. Take all prescribed medication as directed. 4. If taking narcotic pain medication you may want to use an over-the-counter stool softener to avoid constipation. 5. You may take over-the-counter NSAIDs (ibuprofen, naproxen, etc.) as well as over-the-counter acetaminophen as directed on the packaging as a supplement for your pain and may also use it to wean away from the prescription medication.  Activity ? Weightbearing as tolerated ? Keep dressing in place  First Postoperative Visit 1. Your first postop visit will be at least 2 weeks after surgery.  This should be scheduled when you schedule surgery. 2. If you do not have a postoperative visit scheduled please call [phone] to schedule an appointment. 3. At the appointment your incision will be evaluated for suture removal, x-rays will be obtained if necessary.  General Instructions 1. Swelling is very common after foot and ankle surgery.  It often takes 3 months for the foot and ankle to begin to feel comfortable.  Some amount of swelling will persist for 6-12 months. 2. DO NOT change the dressing.  If there is a problem with the dressing (too tight, loose, gets wet, etc.) please contact Dr. Donnie Mesa office. 3. DO NOT get the dressing wet.  For showers you can use an over-the-counter cast cover or wrap a washcloth around the top of your dressing and then cover it with a plastic bag and tape it to your leg. 4. DO NOT soak the incision (no tubs, pools, bath, etc.) until you have approval from Dr. Susa Simmonds.  Contact Dr. Garret Reddish office or go to Emergency Room  if: 1. Temperature above 101 F. 2. Increasing pain that is unresponsive to pain medication or elevation 3. Excessive redness or swelling in your foot 4. Dressing problems - excessive bloody drainage, looseness or tightness, or if dressing gets wet 5. Develop pain, swelling, warmth, or discoloration of your calf  Post Anesthesia Home Care Instructions  Activity: Get plenty of rest for the remainder of the day. A responsible individual must stay with you for 24 hours following the procedure.  For the next 24 hours, DO NOT: -Drive a car -Advertising copywriter -Drink alcoholic beverages -Take any medication unless instructed by your physician -Make any legal decisions or sign important papers.  Meals: Start with liquid foods such as gelatin or soup. Progress to regular foods as tolerated. Avoid greasy, spicy, heavy foods. If nausea and/or vomiting occur, drink only clear liquids until the nausea and/or vomiting subsides. Call your physician if vomiting continues.  Special Instructions/Symptoms: Your throat may feel dry or sore from the anesthesia or the breathing tube placed in your throat during surgery. If this causes discomfort, gargle with warm salt water. The discomfort should disappear within 24 hours.  If you had a scopolamine patch placed behind your ear for the management of post- operative nausea and/or vomiting:  1. The medication in the patch is effective for 72 hours, after which it should be removed.  Wrap patch in a tissue and discard in the trash. Wash hands thoroughly with soap and water. 2. You may remove the patch  earlier than 72 hours if you experience unpleasant side effects which may include dry mouth, dizziness or visual disturbances. 3. Avoid touching the patch. Wash your hands with soap and water after contact with the patch.

## 2020-01-31 NOTE — Anesthesia Postprocedure Evaluation (Signed)
Anesthesia Post Note  Patient: Mike [name]  Procedure(s) Performed: LEFT FOOT HARDWARE REMOVAL (Left Foot)     Patient location during evaluation: PACU Anesthesia Type: General Level of consciousness: awake and alert Pain management: pain level controlled Vital Signs Assessment: post-procedure vital signs reviewed and stable Respiratory status: spontaneous breathing, nonlabored ventilation and respiratory function stable Cardiovascular status: blood pressure returned to baseline and stable Postop Assessment: no apparent nausea or vomiting Anesthetic complications: no    Last Vitals:  Vitals:   01/31/20 0930 01/31/20 0955  BP: 113/75 118/88  Pulse: 79 81  Resp: 10 14  Temp:  (!) 36.1 C  SpO2: 99% 98%    Last Pain:  Vitals:   01/31/20 0930  TempSrc:   PainSc: 8                  Lowella Curb

## 2020-01-31 NOTE — Transfer of Care (Signed)
Immediate Anesthesia Transfer of Care Note  Patient: Mike [name]  Procedure(s) Performed: LEFT FOOT HARDWARE REMOVAL (Left Foot)  Patient Location: PACU  Anesthesia Type:General  Level of Consciousness: awake, alert , oriented and drowsy  Airway & Oxygen Therapy: Patient Spontanous Breathing and Patient connected to face mask oxygen  Post-op Assessment: Report given to RN and Post -op Vital signs reviewed and stable  Post vital signs: Reviewed and stable  Last Vitals:  Vitals Value Taken Time  BP 123/89 01/31/20 0849  Temp    Pulse 89 01/31/20 0851  Resp 12 01/31/20 0851  SpO2 98 % 01/31/20 0851  Vitals shown include unvalidated device data.  Last Pain:  Vitals:   01/31/20 0638  TempSrc: Oral  PainSc: 5       Patients Stated Pain Goal: 4 (01/31/20 9167)  Complications: No apparent anesthesia complications

## 2020-01-31 NOTE — Anesthesia Preprocedure Evaluation (Signed)
Anesthesia Evaluation  Patient identified by MRN, date of birth, ID band Patient awake    Reviewed: Allergy & Precautions, H&P , NPO status , Patient's Chart, lab work & pertinent test results  Airway Mallampati: II  TM Distance: >3 FB Neck ROM: Full    Dental no notable dental hx. (+) Teeth Intact   Pulmonary Current Smoker and Patient abstained from smoking.,    Pulmonary exam normal breath sounds clear to auscultation       Cardiovascular Exercise Tolerance: Good negative cardio ROS Normal cardiovascular exam Rhythm:Regular Rate:Normal     Neuro/Psych Anxiety Depression Bipolar Disorder negative neurological ROS     GI/Hepatic Neg liver ROS, GERD  Controlled,  Endo/Other  negative endocrine ROS  Renal/GU negative Renal ROS  negative genitourinary   Musculoskeletal  (+) Arthritis , Osteoarthritis,    Abdominal   Peds  Hematology negative hematology ROS (+)   Anesthesia Other Findings   Reproductive/Obstetrics negative OB ROS                             Anesthesia Physical  Anesthesia Plan  ASA: II  Anesthesia Plan: General   Post-op Pain Management:    Induction: Intravenous  PONV Risk Score and Plan: 1 and Treatment may vary due to age or medical condition and Ondansetron  Airway Management Planned: LMA  Additional Equipment: None  Intra-op Plan:   Post-operative Plan: Extubation in OR  Informed Consent: I have reviewed the patients History and Physical, chart, labs and discussed the procedure including the risks, benefits and alternatives for the proposed anesthesia with the patient or authorized representative who has indicated his/her understanding and acceptance.     Dental advisory given  Plan Discussed with: CRNA  Anesthesia Plan Comments:         Anesthesia Quick Evaluation

## 2020-02-06 NOTE — Op Note (Addendum)
Mike [name] male 29 y.o. 01/31/2020   PreOperative Diagnosis: Retained orthopedic hardware, left foot Partial resection of third metatarsal Partial resection of second metatarsal  PostOperative Diagnosis: Same  PROCEDURE: Hardware removal deep, left foot  SURGEON: Melony Overly, MD  ASSISTANT: None  ANESTHESIA: General  FINDINGS: Retained deep implants  IMPLANTS: None  INDICATIONS:28 y.o. male underwent a midfoot arthrodesis about two and half years ago.  He had symptomatic orthopedic hardware and wished to have this removed.  CT scan revealed good bony arthrodesis.  We discussed the risks, benefits and alternatives of surgery which include but not limited to wound healing complications, infection, continued pain, need for further surgery and damage to surrounding structures.  After weighing these risks he would like to proceed with surgery.  PROCEDURE: Patient was identified the preoperative holding area.  The left foot was marked by myself.  Consent was signed myself and the patient.  He was taken the operative suite and placed supine the operative table.  A bump was placed under the left hip after general anesthesia was induced without difficulty.  Preoperative antibiotics were given.  Bone foam was used.  All bony prominences were well-padded.  The left lower extremity was prepped and draped in the usual sterile fashion.  The surgical timeout is performed.  The left leg was elevated and the tourniquet was inflated to 250 mmHg.  We began by making a longitudinal incision overlying his old surgical incision.  This taken sharply down through skin and subcutaneous tissue.  Care was taken to identify the extensor houses longus and tibialis anterior tendon sheaths and these were incised and the tendons were retracted.  Then were able to sharply dissect down to the plate fixation.  Plate was identified and using blunt dissection the plate was cleared of scar tissue.  Then using  the screwdriver the screws within the plate were removed.  The plate was removed without difficulty.  Then further dissection was carried back anteriorly and medially and laterally to identify the single screws that were placed across the second tarsometatarsal joint and the Lisfranc joint.  These were removed without difficulty.  Then the incision was extended distally along the interval between the first and second metatarsals.  Then blunt dissection was used to identify the extensor tendon of the second toe.  Then sharp dissection was used to mobilize this tendon.  The interval between the second and third metatarsal neck region was identified.  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  Using an osteotome partial resection of the second metatarsal neck and third metatarsal neck were performed.  The bone was removed with a rondure and good space was obtained between the necks of the second and third metatarsals.  Fluoroscopy confirmed all hardware was removed and that there was good space between the bones at the site of the prior synostosis.  The wound was irrigated copiously with normal saline.  The tourniquet was released.  The wound was closed in layered fashion using 3-0 Monocryl and nylon suture.  A soft dressing was placed.  All counts were correct at the end the case.  There are no complications.  He tolerated the procedure well.  He was awakened from anesthesia and taken to recovery in stable condition.  POST OPERATIVE INSTRUCTIONS: Soft dressing to left foot Weightbearing as tolerated to left foot He will follow-up in 2 weeks for wound check, suture removal if appropriate.  No x-rays needed. Call the office  with concerns.  BLOOD LOSS:  less than 50 mL         DRAINS: none         SPECIMEN: none       COMPLICATIONS:  * No complications entered in OR log *         Disposition: PACU - hemodynamically stable.         Condition: stable

## 2020-02-07 ENCOUNTER — Encounter: Payer: Self-pay | Admitting: *Deleted

## 2020-05-14 ENCOUNTER — Other Ambulatory Visit: Payer: Self-pay | Admitting: Student

## 2020-05-14 DIAGNOSIS — M25552 Pain in left hip: Secondary | ICD-10-CM

## 2020-05-23 ENCOUNTER — Other Ambulatory Visit: Payer: Self-pay | Admitting: Family Medicine

## 2020-05-24 ENCOUNTER — Inpatient Hospital Stay: Admission: RE | Admit: 2020-05-24 | Payer: Medicaid Other | Source: Ambulatory Visit

## 2020-05-24 ENCOUNTER — Other Ambulatory Visit: Payer: Medicaid Other

## 2020-06-14 ENCOUNTER — Ambulatory Visit
Admission: RE | Admit: 2020-06-14 | Discharge: 2020-06-14 | Disposition: A | Discharge status: Home / Self Care | Payer: Medicaid Other | Source: Ambulatory Visit | Attending: Student | Admitting: Student

## 2020-06-14 ENCOUNTER — Other Ambulatory Visit: Payer: Self-pay

## 2020-06-14 DIAGNOSIS — M25552 Pain in left hip: Secondary | ICD-10-CM

## 2020-06-14 MED ORDER — IOPAMIDOL (ISOVUE-M 200) INJECTION 41%
12.0000 mL | Freq: Once | INTRAMUSCULAR | Status: AC
  Administered 2020-06-14: 12 mL via INTRA_ARTICULAR

## 2020-06-26 ENCOUNTER — Inpatient Hospital Stay (HOSPITAL_COMMUNITY): Admission: RE | Admit: 2020-06-26 | Payer: Medicaid Other | Source: Ambulatory Visit

## 2020-06-26 NOTE — Progress Notes (Signed)
Sherri at Dr. Austin Miles office aware patient is wanting to reschedule procedure.

## 2020-06-28 ENCOUNTER — Ambulatory Visit (HOSPITAL_BASED_OUTPATIENT_CLINIC_OR_DEPARTMENT_OTHER): Admission: RE | Admit: 2020-06-28 | Payer: Medicaid Other | Source: Ambulatory Visit | Admitting: Orthopaedic Surgery

## 2020-06-28 ENCOUNTER — Encounter (HOSPITAL_BASED_OUTPATIENT_CLINIC_OR_DEPARTMENT_OTHER): Admission: RE | Payer: Self-pay | Source: Ambulatory Visit

## 2020-06-28 SURGERY — ARTHROSCOPY, KNEE, WITH LATERAL RETINACULUM RELEASE
Anesthesia: Choice | Laterality: Left

## 2020-09-26 ENCOUNTER — Encounter (HOSPITAL_BASED_OUTPATIENT_CLINIC_OR_DEPARTMENT_OTHER): Payer: Self-pay | Admitting: Orthopaedic Surgery

## 2020-09-26 ENCOUNTER — Other Ambulatory Visit: Payer: Self-pay

## 2020-09-29 ENCOUNTER — Other Ambulatory Visit (HOSPITAL_COMMUNITY)
Admission: RE | Admit: 2020-09-29 | Discharge: 2020-09-29 | Disposition: A | Discharge status: Home / Self Care | Payer: Medicaid Other | Source: Ambulatory Visit | Attending: Orthopaedic Surgery | Admitting: Orthopaedic Surgery

## 2020-09-29 DIAGNOSIS — Z20822 Contact with and (suspected) exposure to covid-19: Secondary | ICD-10-CM | POA: Diagnosis not present

## 2020-09-29 DIAGNOSIS — Z01812 Encounter for preprocedural laboratory examination: Secondary | ICD-10-CM | POA: Insufficient documentation

## 2020-09-30 LAB — SARS CORONAVIRUS 2 (TAT 6-24 HRS): SARS Coronavirus 2: NEGATIVE
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
## 2020-10-02 NOTE — Anesthesia Preprocedure Evaluation (Addendum)
Anesthesia Evaluation  Patient identified by MRN, date of birth, ID band Patient awake    Reviewed: Allergy & Precautions, NPO status , Patient's Chart, lab work & pertinent test results  Airway Mallampati: II  TM Distance: >3 FB Neck ROM: Full    Dental no notable dental hx. (+) Teeth Intact   Pulmonary Current Smoker and Patient abstained from smoking.,    Pulmonary exam normal breath sounds clear to auscultation       Cardiovascular negative cardio ROS Normal cardiovascular exam Rhythm:Regular Rate:Normal     Neuro/Psych PSYCHIATRIC DISORDERS Anxiety Depression Bipolar Disorder PTSDnegative neurological ROS     GI/Hepatic GERD  Medicated and Controlled,(+)     substance abuse  cocaine use, Hx/o heroin and cocaine abuse- last use more than 2 years ago.  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   Abdominal   Peds  Hematology   Anesthesia Other Findings   Reproductive/Obstetrics                           Anesthesia Physical Anesthesia Plan  ASA: II  Anesthesia Plan: General   Post-op Pain Management:    Induction: Intravenous  PONV Risk Score and Plan: 2 and Ondansetron, Midazolam, Treatment may vary due to age or medical condition and Dexamethasone  Airway Management Planned: LMA  Additional Equipment:   Intra-op Plan:   Post-operative Plan: Extubation in OR  Informed Consent: I have reviewed the patients History and Physical, chart, labs and discussed the procedure including the risks, benefits and alternatives for the proposed anesthesia with the patient or authorized representative who has indicated his/her understanding and acceptance.     Dental advisory given  Plan Discussed  with: CRNA and Anesthesiologist  Anesthesia Plan Comments:        Anesthesia Quick Evaluation

## 2020-10-03 ENCOUNTER — Ambulatory Visit (HOSPITAL_BASED_OUTPATIENT_CLINIC_OR_DEPARTMENT_OTHER): Payer: Medicaid Other | Admitting: Anesthesiology

## 2020-10-03 ENCOUNTER — Encounter (HOSPITAL_BASED_OUTPATIENT_CLINIC_OR_DEPARTMENT_OTHER)
Admission: RE | Disposition: A | Discharge status: Home / Self Care | Payer: Self-pay | Source: Ambulatory Visit | Attending: Orthopaedic Surgery

## 2020-10-03 ENCOUNTER — Other Ambulatory Visit: Payer: Self-pay

## 2020-10-03 ENCOUNTER — Ambulatory Visit (HOSPITAL_BASED_OUTPATIENT_CLINIC_OR_DEPARTMENT_OTHER)
Admission: RE | Admit: 2020-10-03 | Discharge: 2020-10-03 | Disposition: A | Discharge status: Home / Self Care | Payer: Medicaid Other | Source: Ambulatory Visit | Attending: Orthopaedic Surgery | Admitting: Orthopaedic Surgery

## 2020-10-03 ENCOUNTER — Encounter (HOSPITAL_BASED_OUTPATIENT_CLINIC_OR_DEPARTMENT_OTHER): Payer: Self-pay | Admitting: Orthopaedic Surgery

## 2020-10-03 DIAGNOSIS — F17210 Nicotine dependence, cigarettes, uncomplicated: Secondary | ICD-10-CM | POA: Diagnosis not present

## 2020-10-03 DIAGNOSIS — Z791 Long term (current) use of non-steroidal anti-inflammatories (NSAID): Secondary | ICD-10-CM | POA: Insufficient documentation

## 2020-10-03 DIAGNOSIS — M2242 Chondromalacia patellae, left knee: Secondary | ICD-10-CM | POA: Diagnosis not present

## 2020-10-03 DIAGNOSIS — M1712 Unilateral primary osteoarthritis, left knee: Secondary | ICD-10-CM | POA: Insufficient documentation

## 2020-10-03 DIAGNOSIS — Z79899 Other long term (current) drug therapy: Secondary | ICD-10-CM | POA: Diagnosis not present

## 2020-10-03 HISTORY — PX: KNEE ARTHROSCOPY WITH LATERAL RELEASE: SHX5649

## 2020-10-03 HISTORY — PX: CHONDROPLASTY: SHX5177

## 2020-10-03 SURGERY — ARTHROSCOPY, KNEE, WITH LATERAL RETINACULUM RELEASE
Anesthesia: General | Site: Knee | Laterality: Left

## 2020-10-03 MED ORDER — DEXAMETHASONE SODIUM PHOSPHATE 10 MG/ML IJ SOLN
INTRAMUSCULAR | Status: DC | PRN
  Administered 2020-10-03: 5 mg via INTRAVENOUS

## 2020-10-03 MED ORDER — NAPROXEN 500 MG PO TBEC: 500.0000 mg | DELAYED_RELEASE_TABLET | Freq: Two times a day (BID) | ORAL | 0 refills | Status: AC

## 2020-10-03 MED ORDER — PROPOFOL 10 MG/ML IV BOLUS
INTRAVENOUS | Status: DC | PRN
  Administered 2020-10-03: 180 mg via INTRAVENOUS
  Administered 2020-10-03: 20 mg via INTRAVENOUS

## 2020-10-03 MED ORDER — ASPIRIN 81 MG PO CHEW: 81.0000 mg | CHEWABLE_TABLET | Freq: Two times a day (BID) | ORAL | 0 refills | Status: AC

## 2020-10-03 MED ORDER — OXYCODONE HCL 5 MG PO TABS
5.0000 mg | ORAL_TABLET | Freq: Once | ORAL | Status: AC | PRN
  Administered 2020-10-03: 5 mg via ORAL

## 2020-10-03 MED ORDER — ONDANSETRON HCL 4 MG/2ML IJ SOLN: 4.0000 mg | Freq: Once | INTRAMUSCULAR | Status: DC | PRN

## 2020-10-03 MED ORDER — SODIUM CHLORIDE 0.9 % IR SOLN
Status: DC | PRN
  Administered 2020-10-03: 2000 mL

## 2020-10-03 MED ORDER — MIDAZOLAM HCL 2 MG/2ML IJ SOLN
INTRAMUSCULAR | Status: DC | PRN
  Administered 2020-10-03 (×2): 1 mg via INTRAVENOUS

## 2020-10-03 MED ORDER — KETOROLAC TROMETHAMINE 10 MG PO TABS: 10.0000 mg | ORAL_TABLET | Freq: Three times a day (TID) | ORAL | 0 refills | Status: AC

## 2020-10-03 MED ORDER — LIDOCAINE 2% (20 MG/ML) 5 ML SYRINGE
INTRAMUSCULAR | Status: AC
  Filled 2020-10-03: qty 5

## 2020-10-03 MED ORDER — DEXMEDETOMIDINE (PRECEDEX) IN NS 20 MCG/5ML (4 MCG/ML) IV SYRINGE
PREFILLED_SYRINGE | INTRAVENOUS | Status: DC | PRN
  Administered 2020-10-03: 8 ug via INTRAVENOUS
  Administered 2020-10-03: 4 ug via INTRAVENOUS
  Administered 2020-10-03 (×6): 8 ug via INTRAVENOUS

## 2020-10-03 MED ORDER — LIDOCAINE HCL (CARDIAC) PF 100 MG/5ML IV SOSY
PREFILLED_SYRINGE | INTRAVENOUS | Status: DC | PRN
  Administered 2020-10-03: 100 mg via INTRAVENOUS

## 2020-10-03 MED ORDER — EPINEPHRINE PF 1 MG/ML IJ SOLN
INTRAMUSCULAR | Status: AC
  Filled 2020-10-03: qty 10

## 2020-10-03 MED ORDER — BUPIVACAINE HCL (PF) 0.25 % IJ SOLN
INTRAMUSCULAR | Status: AC
  Filled 2020-10-03: qty 90

## 2020-10-03 MED ORDER — TRAMADOL HCL 50 MG PO TABS: 50.0000 mg | ORAL_TABLET | Freq: Three times a day (TID) | ORAL | 0 refills | Status: DC

## 2020-10-03 MED ORDER — ONDANSETRON HCL 4 MG PO TABS: 4.0000 mg | ORAL_TABLET | Freq: Three times a day (TID) | ORAL | 1 refills | Status: AC | PRN

## 2020-10-03 MED ORDER — PROPOFOL 10 MG/ML IV BOLUS
INTRAVENOUS | Status: AC
  Filled 2020-10-03: qty 20

## 2020-10-03 MED ORDER — GLYCOPYRROLATE 0.2 MG/ML IJ SOLN
INTRAMUSCULAR | Status: AC
  Filled 2020-10-03: qty 4

## 2020-10-03 MED ORDER — FENTANYL CITRATE (PF) 100 MCG/2ML IJ SOLN
INTRAMUSCULAR | Status: DC | PRN
  Administered 2020-10-03: 25 ug via INTRAVENOUS
  Administered 2020-10-03: 75 ug via INTRAVENOUS

## 2020-10-03 MED ORDER — CEFAZOLIN SODIUM-DEXTROSE 2-4 GM/100ML-% IV SOLN
INTRAVENOUS | Status: AC
  Filled 2020-10-03: qty 100

## 2020-10-03 MED ORDER — LACTATED RINGERS IV SOLN: INTRAVENOUS | Status: DC

## 2020-10-03 MED ORDER — OXYCODONE HCL 5 MG/5ML PO SOLN
5.0000 mg | Freq: Once | ORAL | Status: AC | PRN
Start: 2020-10-03 — End: 2020-10-03

## 2020-10-03 MED ORDER — GLYCOPYRROLATE 0.2 MG/ML IJ SOLN
INTRAMUSCULAR | Status: DC | PRN
  Administered 2020-10-03: .2 mg via INTRAVENOUS

## 2020-10-03 MED ORDER — DEXAMETHASONE SODIUM PHOSPHATE 10 MG/ML IJ SOLN
INTRAMUSCULAR | Status: AC
  Filled 2020-10-03: qty 1

## 2020-10-03 MED ORDER — FENTANYL CITRATE (PF) 100 MCG/2ML IJ SOLN
INTRAMUSCULAR | Status: AC
  Filled 2020-10-03: qty 2

## 2020-10-03 MED ORDER — DEXMEDETOMIDINE (PRECEDEX) IN NS 20 MCG/5ML (4 MCG/ML) IV SYRINGE
PREFILLED_SYRINGE | INTRAVENOUS | Status: AC
  Filled 2020-10-03: qty 10

## 2020-10-03 MED ORDER — MIDAZOLAM HCL 2 MG/2ML IJ SOLN
INTRAMUSCULAR | Status: AC
  Filled 2020-10-03: qty 2

## 2020-10-03 MED ORDER — ONDANSETRON HCL 4 MG/2ML IJ SOLN
INTRAMUSCULAR | Status: AC
  Filled 2020-10-03: qty 2

## 2020-10-03 MED ORDER — ONDANSETRON HCL 4 MG/2ML IJ SOLN
INTRAMUSCULAR | Status: DC | PRN
  Administered 2020-10-03: 4 mg via INTRAVENOUS

## 2020-10-03 MED ORDER — OXYCODONE HCL 5 MG PO TABS
ORAL_TABLET | ORAL | Status: AC
  Filled 2020-10-03: qty 1

## 2020-10-03 MED ORDER — BUPIVACAINE HCL 0.25 % IJ SOLN
INTRAMUSCULAR | Status: DC | PRN
  Administered 2020-10-03: 20 mL via INTRA_ARTICULAR

## 2020-10-03 MED ORDER — FENTANYL CITRATE (PF) 100 MCG/2ML IJ SOLN
25.0000 ug | INTRAMUSCULAR | Status: DC | PRN
  Administered 2020-10-03 (×2): 25 ug via INTRAVENOUS
  Administered 2020-10-03: 50 ug via INTRAVENOUS

## 2020-10-03 MED ORDER — ACETAMINOPHEN 500 MG PO TABS: 1000.0000 mg | ORAL_TABLET | Freq: Three times a day (TID) | ORAL | 0 refills | Status: AC

## 2020-10-03 MED ORDER — CEFAZOLIN SODIUM-DEXTROSE 2-4 GM/100ML-% IV SOLN
2.0000 g | INTRAVENOUS | Status: AC
  Administered 2020-10-03: 2 g via INTRAVENOUS

## 2020-10-03 SURGICAL SUPPLY — 44 items
APL PRP STRL LF DISP 70% ISPRP (MISCELLANEOUS) ×1
BANDAGE ESMARK 6X9 LF (GAUZE/BANDAGES/DRESSINGS) IMPLANT
BLADE CLIPPER SURG (BLADE) IMPLANT
BLADE EXCALIBUR 4.0X13 (MISCELLANEOUS) ×2 IMPLANT
BNDG CMPR 9X6 STRL LF SNTH (GAUZE/BANDAGES/DRESSINGS)
BNDG ELASTIC 6X5.8 VLCR STR LF (GAUZE/BANDAGES/DRESSINGS) ×2 IMPLANT
BNDG ESMARK 6X9 LF (GAUZE/BANDAGES/DRESSINGS)
CHLORAPREP W/TINT 26 (MISCELLANEOUS) ×2 IMPLANT
CLSR STERI-STRIP ANTIMIC 1/2X4 (GAUZE/BANDAGES/DRESSINGS) ×2 IMPLANT
CUFF TOURN SGL QUICK 34 (TOURNIQUET CUFF)
CUFF TRNQT CYL 34X4.125X (TOURNIQUET CUFF) IMPLANT
DISSECTOR 3.5MM X 13CM CVD (MISCELLANEOUS) IMPLANT
DISSECTOR 4.0MMX13CM CVD (MISCELLANEOUS) ×2 IMPLANT
DRAPE ARTHROSCOPY W/POUCH 90 (DRAPES) ×2 IMPLANT
DRAPE IMP U-DRAPE 54X76 (DRAPES) ×2 IMPLANT
DRAPE U-SHAPE 47X51 STRL (DRAPES) ×2 IMPLANT
GAUZE SPONGE 4X4 12PLY STRL (GAUZE/BANDAGES/DRESSINGS) ×2 IMPLANT
GLOVE BIO SURGEON STRL SZ 6.5 (GLOVE) ×2 IMPLANT
GLOVE BIOGEL PI IND STRL 7.0 (GLOVE) ×2 IMPLANT
GLOVE BIOGEL PI IND STRL 7.5 (GLOVE) ×2 IMPLANT
GLOVE BIOGEL PI INDICATOR 7.0 (GLOVE) ×2
GLOVE BIOGEL PI INDICATOR 7.5 (GLOVE) ×2
GLOVE ECLIPSE 6.5 STRL STRAW (GLOVE) ×2 IMPLANT
GLOVE ECLIPSE 8.0 STRL XLNG CF (GLOVE) ×2 IMPLANT
GLOVE SRG 8 PF TXTR STRL LF DI (GLOVE) ×1 IMPLANT
GLOVE SURG UNDER POLY LF SZ6.5 (GLOVE) ×2 IMPLANT
GLOVE SURG UNDER POLY LF SZ8 (GLOVE) ×2
GOWN STRL REUS W/ TWL LRG LVL3 (GOWN DISPOSABLE) ×1 IMPLANT
GOWN STRL REUS W/TWL LRG LVL3 (GOWN DISPOSABLE) ×2
GOWN STRL REUS W/TWL XL LVL3 (GOWN DISPOSABLE) ×4 IMPLANT
KIT TURNOVER KIT B (KITS) ×2 IMPLANT
MANIFOLD NEPTUNE II (INSTRUMENTS) ×2 IMPLANT
NDL SAFETY ECLIPSE 18X1.5 (NEEDLE) ×1 IMPLANT
NEEDLE HYPO 18GX1.5 SHARP (NEEDLE) ×2
NS IRRIG 1000ML POUR BTL (IV SOLUTION) IMPLANT
PACK ARTHROSCOPY DSU (CUSTOM PROCEDURE TRAY) ×2 IMPLANT
PORT APPOLLO RF 90DEGREE MULTI (SURGICAL WAND) ×2 IMPLANT
SLEEVE SCD COMPRESS KNEE MED (MISCELLANEOUS) ×2 IMPLANT
SUT MNCRL AB 4-0 PS2 18 (SUTURE) ×2 IMPLANT
SYR 5ML LUER SLIP (SYRINGE) ×2 IMPLANT
TOWEL GREEN STERILE FF (TOWEL DISPOSABLE) ×2 IMPLANT
TUBE CONNECTING 20X1/4 (TUBING) ×2 IMPLANT
TUBING ARTHROSCOPY IRRIG 16FT (MISCELLANEOUS) ×2 IMPLANT
WATER STERILE IRR 1000ML POUR (IV SOLUTION) IMPLANT

## 2020-10-03 NOTE — Anesthesia Postprocedure Evaluation (Signed)
Anesthesia Post Note  Patient: Mike [name]  Procedure(s) Performed: KNEE ARTHROSCOPY WITH LATERAL RELEASE (Left Knee) CHONDROPLASTY (Left Knee)     Patient location during evaluation: PACU Anesthesia Type: General Level of consciousness: awake and alert and oriented Pain management: pain level controlled Vital Signs Assessment: post-procedure vital signs reviewed and stable Respiratory status: spontaneous breathing, nonlabored ventilation and respiratory function stable Cardiovascular status: blood pressure returned to baseline and stable Postop Assessment: no apparent nausea or vomiting Anesthetic complications: no   No complications documented.  Last Vitals:  Vitals:   10/03/20 0945 10/03/20 1010  BP: 112/75   Pulse: 73 70  Resp: 13 10  Temp:    SpO2: 99% 95%    Last Pain:  Vitals:   10/03/20 1010  TempSrc:   PainSc: 6                  Neaveh Belanger A.

## 2020-10-03 NOTE — Discharge Instructions (Signed)
Ramond Marrow MD, MPH  Alfonse Alpers, PA-C  [facility]  [address], [address]  [phone] (tel)   [phone] (fax)    POST-OPERATIVE INSTRUCTIONS - Knee Arthroscopy   WOUND CARE  - You may remove the Operative Dressing on Post-Op Day #3 (72hrs after surgery).   - Alternatively if you would like you can leave dressing on until follow-up if within 7-8 days but keep it dry.  - Leave steri-strips in place until they fall off on their own, usually 2 weeks postop.  - An ACE wrap may be used to control swelling, do not wrap this too tight. If the initial ACE wrap feels too tight you may loosen it.  - There may be a small amount of fluid/bleeding leaking at the surgical site.  - This is normal; the knee is filled with fluid during the procedure and can leak for 24-48hrs after surgery. You may change/reinforce the bandage as needed.  - Use the Cryocuff or Ice as often as possible for the first 7 days, then as needed for pain relief. Always keep a towel, ACE wrap or other barrier between the cooling unit and your skin.  - You may shower on Post-Op Day #3. Gently pat the area dry. Do not soak the knee in water or submerge it.  - Do not go swimming in the pool or ocean until 4 weeks after surgery or when otherwise instructed. Keep dry incisions as dry as possible.    BRACE/AMBULATION  -      You will not need a brace after this procedure.   - You may use crutches if needed to help you ambulate, but this is not required  - You can put full weight on your operative leg as you feel comfortable   REGIONAL ANESTHESIA (NERVE BLOCKS)  - The anesthesia team may have performed a nerve block for you if safe in the setting of your care. This is a great tool used to minimize pain. Typically the block may start wearing off overnight. This can be a challenging period but please utilize your as needed pain medications to try and manage this period and know it will be a  brief transition as the nerve block wears completely   POST-OP MEDICATIONS - Multimodal approach to pain control  - In general your pain will be controlled with a combination of substances. Prescriptions unless otherwise discussed are electronically sent to your pharmacy. This is a carefully made plan we use to minimize narcotic use.    - Toradol - this is a strong anti-inflammatory                   - Take 1 tablet every 8 hours for 3 days                   - Then begin taking Naproxen                  - Do not take Naproxen and Toradol together!  - Naproxen - Anti-inflammatory medication taken on a scheduled basis  - Acetaminophen - Non-narcotic pain medicine taken on a scheduled basis  - Tramadol - This is a strong narcotic, to be used only on an "as needed" basis for pain.                  - Only if pain not controlled by the above medications  - Aspirin 81mg  - This medicine is used to minimize  the risk of blood clots after surgery.  - Zofran - take as needed for nausea    FOLLOW-UP   Please call the office to schedule a follow-up appointment for your incision check, 7-10 days post-operatively.   IF YOU HAVE ANY QUESTIONS, PLEASE FEEL FREE TO CALL OUR OFFICE.    HELPFUL INFORMATION   - If you had a block, it will wear off between 8-24 hrs postop typically. This is period when your pain may go from nearly zero to the pain you would have had post-op without the block. This is an abrupt transition but nothing dangerous is happening. You may take an extra dose of narcotic when this happens.    Keep your leg elevated to decrease swelling, which will then in turn decrease your pain. I would elevate the foot of your bed by putting a couple of couch pillows between your mattress and box spring. I would not keep pillow directly under your ankle.   - Do not sleep with a pillow behind your knee even if it is more comfortable as this may make it harder to get your knee fully straight  long term.    There will be MORE swelling on days 1-3 than there is on the day of surgery. This also is normal. The swelling will decrease with the anti-inflammatory medication, ice and keeping it elevated. The swelling will make it more difficult to bend your knee. As the swelling goes down your motion will become easier    You may develop swelling and bruising that extends from your knee down to your calf and perhaps even to your foot over the next week. Do not be alarmed. This too is normal, and it is due to gravity    There may be some numbness adjacent to the incision site. This may last for 6-12 months or longer in some patients and is expected.    You may return to sedentary work/school in the next couple of days when you feel up to it. You will need to keep your leg elevated as much as possible    You should wean off your narcotic medicines as soon as you are able. Most patients will be off or using minimal narcotics before their first postop appointment.    We suggest you use the pain medication the first night prior to going to bed, in order to ease any pain when the anesthesia wears off. You should avoid taking pain medications on an empty stomach as it will make you nauseous.    Do not drink alcoholic beverages or take illicit drugs when taking pain medications.    It is against the law to drive while taking narcotics. You cannot drive if your Right leg is in brace locked in extension.    Pain medication may make you constipated. Below are a few solutions to try in this order:  o Decrease the amount of pain medication if you aren't having pain.  o Drink lots of decaffeinated fluids.  o Drink prune juice and/or eat dried prunes   o If the first 3 don't work start with additional solutions  o Take Colace - an over-the-counter stool softener  o Take Senokot - an over-the-counter laxative  o Take Miralax - a stronger over-the-counter laxative   Post Anesthesia Home  Care Instructions  Activity: Get plenty of rest for the remainder of the day. A responsible individual must stay with you for 24 hours following the procedure.  For the next 24 hours,  DO NOT: -Drive a car -Advertising copywriter -Drink alcoholic beverages -Take any medication unless instructed by your physician -Make any legal decisions or sign important papers.  Meals: Start with liquid foods such as gelatin or soup. Progress to regular foods as tolerated. Avoid greasy, spicy, heavy foods. If nausea and/or vomiting occur, drink only clear liquids until the nausea and/or vomiting subsides. Call your physician if vomiting continues.  Special Instructions/Symptoms: Your throat may feel dry or sore from the anesthesia or the breathing tube placed in your throat during surgery. If this causes discomfort, gargle with warm salt water. The discomfort should disappear within 24 hours.  If you had a scopolamine patch placed behind your ear for the management of post- operative nausea and/or vomiting:  1. The medication in the patch is effective for 72 hours, after which it should be removed.  Wrap patch in a tissue and discard in the trash. Wash hands thoroughly with soap and water. 2. You may remove the patch earlier than 72 hours if you experience unpleasant side effects which may include dry mouth, dizziness or visual disturbances. 3. Avoid touching the patch. Wash your hands with soap and water after contact with the patch.

## 2020-10-03 NOTE — Transfer of Care (Signed)
Immediate Anesthesia Transfer of Care Note  Patient: Mike [name]  Procedure(s) Performed: KNEE ARTHROSCOPY WITH LATERAL RELEASE (Left Knee) CHONDROPLASTY (Left Knee)  Patient Location: PACU  Anesthesia Type:General  Level of Consciousness: sedated, drowsy and patient cooperative  Airway & Oxygen Therapy: Patient Spontanous Breathing and Patient connected to face mask oxygen  Post-op Assessment: Report given to RN and Post -op Vital signs reviewed and stable  Post vital signs: Reviewed and stable  Last Vitals:  Vitals Value Taken Time  BP 114/76 10/03/20 0910  Temp 36.7 C 10/03/20 0910  Pulse 63 10/03/20 0913  Resp 14 10/03/20 0913  SpO2 100 % 10/03/20 0913  Vitals shown include unvalidated device data.  Last Pain:  Vitals:   10/03/20 0738  TempSrc: Oral  PainSc: 0-No pain         Complications: No complications documented.

## 2020-10-03 NOTE — Interval H&P Note (Signed)
History and Physical Interval Note:  10/03/2020 8:14 AM  Mike [name]  has presented today for surgery, with the diagnosis of OSTEOARTHRITIS LEFT KNEE,CHONDROMALACIA PATELLAE.  The various methods of treatment have been discussed with the patient and family. After consideration of risks, benefits and other options for treatment, the patient has consented to  Procedure(s): KNEE ARTHROSCOPY WITH LATERAL MENISECTOMY (Left) CHONDROPLASTY (Left) as a surgical intervention.  The patient's history has been reviewed, patient examined, no change in status, stable for surgery.  I have reviewed the patient's chart and labs.  Questions were answered to the patient's satisfaction.     Bjorn Pippin

## 2020-10-03 NOTE — Anesthesia Procedure Notes (Addendum)
Procedure Name: LMA Insertion Date/Time: 10/03/2020 8:30 AM Performed by: Salomon Mast, CRNA Pre-anesthesia Checklist: Patient identified, Emergency Drugs available, Suction available and Patient being monitored Patient Re-evaluated:Patient Re-evaluated prior to induction Oxygen Delivery Method: Circle system utilized Preoxygenation: Pre-oxygenation with 100% oxygen Induction Type: IV induction LMA: LMA inserted LMA Size: 4.0 Number of attempts: 1 Placement Confirmation: positive ETCO2 and breath sounds checked- equal and bilateral Tube secured with: Tape Dental Injury: Teeth and Oropharynx as per pre-operative assessment

## 2020-10-03 NOTE — Op Note (Signed)
Orthopaedic Surgery Operative Note (CSN: [id number])  Mike [name]  [birth year] Date of Surgery: 10/03/2020   Diagnoses:  OSTEOARTHRITIS LEFT KNEE,CHONDROMALACIA PATELLAE  Procedure: Left knee lateral release and lysis of adhesions with chondroplasty of the patellofemoral joint   Operative Finding Exam under anesthesia: Full extension noted, flexion 125 degrees, good endpoint on Lachman posterior drawer with no varus valgus instability.  Previous ligamentous reconstruction had left a essentially normal appearing exam today. Suprapatellar pouch: Normal Patellofemoral Compartment: Significant anterior interval scarring and tissue similar to a cyclops that was likely catching in the inferior portion of the patella.  We resected all of this and released the anterior interval extensively.  Gentle chondroplasty performed of the inferior pole of the patella but the patella overall had some mild grade 1 softening but overall was okay.  Trochlea was normal.  Patellar tracking was normal.  Lateral tilt was noted. Medial Compartment: Normal Lateral Compartment: Normal Intercondylar Notch: PCL intact, internal brace noted, appropriate tension taken on a posterior drawer.  Successful completion of the planned procedure.  Patient had significant anterior interval scarring and tightness in the patellofemoral compartment which is likely causing his grinding.  We released this tissue and we feel it will improve his tracking and his pain.  His ligament exam appears completely normal.  Post-operative plan: The patient will be weightbearing to tolerance.  The patient will be discharged home with avoidance of narcotics in the setting of previous narcotic abuse.  DVT prophylaxis Aspirin 81 mg twice daily for 6 weeks.  Pain control with PRN pain medication preferring oral medicines.  Follow up plan will be scheduled in approximately 7 days for incision check.  Post-Op Diagnosis: Same Surgeons:Primary: Bjorn Pippin,  MD Assistants:Caroline McBane PA-C Location: [facility] OR ROOM 6 Anesthesia: General with local anesthetic Antibiotics: Ancef 2 g Tourniquet time: None Estimated Blood Loss: Minimal Complications: None Specimens: None Implants: * No implants in log *  Indications for Surgery:   Mike [name] is a 29 y.o. male with previous history of multiligamentous reconstruction 3 years ago with good stability however he had tightness in the anterior knee and patellofemoral complaints.  MRI demonstrated some chondromalacia of the patella as well as lateral tilt.  We felt that this is bothersome enough in the setting of his continued symptoms to consider surgery.  Benefits and risks of operative and nonoperative management were discussed prior to surgery with patient/guardian(s) and informed consent form was completed.  Specific risks including infection, need for additional surgery, continued pain, instability, need for further surgery amongst others   Procedure:   The patient was identified properly. Informed consent was obtained and the surgical site was marked. The patient was taken up to suite where general anesthesia was induced. The patient was placed in the supine position with a post against the surgical leg and a nonsterile tourniquet applied. The surgical leg was then prepped and draped usual sterile fashion.  A standard surgical timeout was performed.  2 standard anterior portals were made and diagnostic arthroscopy performed. Please note the findings as noted above.  We identified no significant anterior interval scarring.  There is no obvious cyclops lesion however there is scarring in the infrapatellar fat pad was significant.  We released this and resected it with a shaver as well as an RF ablator.  We then cleared the joint as above.  He performed a lateral release and avoid any significant bleeders on the lateral side.  Appropriate tension and tracking of the patella was  noted.  Incisions closed  with absorbable suture. The patient was awoken from general anesthesia and taken to the PACU in stable condition without complication.   Alfonse Alpers, PA-C, present and scrubbed throughout the case, critical for completion in a timely fashion, and for retraction, instrumentation, closure.

## 2020-10-04 ENCOUNTER — Encounter (HOSPITAL_BASED_OUTPATIENT_CLINIC_OR_DEPARTMENT_OTHER): Payer: Self-pay | Admitting: Orthopaedic Surgery

## 2020-10-04 NOTE — Addendum Note (Signed)
Addendum  created 10/04/20 1215 by Mateja Dier, Jewel Baize, CRNA   Charge Capture section accepted

## 2020-10-04 NOTE — Addendum Note (Signed)
Addendum  created 10/04/20 1217 by Thaddaeus Granja, Jewel Baize, CRNA   Charge Capture section accepted

## 2020-12-13 ENCOUNTER — Ambulatory Visit: Payer: Medicaid Other | Attending: Orthopaedic Surgery | Admitting: Physical Therapy

## 2020-12-13 ENCOUNTER — Encounter: Payer: Self-pay | Admitting: Physical Therapy

## 2020-12-13 ENCOUNTER — Other Ambulatory Visit: Payer: Self-pay

## 2020-12-13 DIAGNOSIS — M25662 Stiffness of left knee, not elsewhere classified: Secondary | ICD-10-CM | POA: Diagnosis present

## 2020-12-13 DIAGNOSIS — M6281 Muscle weakness (generalized): Secondary | ICD-10-CM

## 2020-12-13 DIAGNOSIS — G8929 Other chronic pain: Secondary | ICD-10-CM | POA: Insufficient documentation

## 2020-12-13 DIAGNOSIS — R262 Difficulty in walking, not elsewhere classified: Secondary | ICD-10-CM | POA: Diagnosis present

## 2020-12-13 DIAGNOSIS — M25562 Pain in left knee: Secondary | ICD-10-CM | POA: Insufficient documentation
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
## 2020-12-13 NOTE — Patient Instructions (Signed)
    Access Code: [email or web address] URL: [email or web address] Date: 12/13/2020 Prepared by: Glenetta Hew  Exercises Hooklying Hamstring Stretch with Strap - 2-3 x daily - 7 x weekly - 3 reps - 30 sec hold Supine ITB Stretch with Strap - 2-3 x daily - 7 x weekly - 3 reps - 30 sec hold Supine Quadriceps Stretch with Strap on Table - 2-3 x daily - 7 x weekly - 3 reps - 30 sec hold Active Straight Leg Raise with Quad Set - 2 x daily - 7 x weekly - 2 sets - 10 reps - 3-5 sec hold

## 2020-12-18 ENCOUNTER — Ambulatory Visit: Payer: Medicaid Other | Attending: Orthopaedic Surgery

## 2020-12-20 ENCOUNTER — Ambulatory Visit: Payer: Medicaid Other

## 2020-12-27 ENCOUNTER — Ambulatory Visit: Payer: Medicaid Other

## 2020-12-31 ENCOUNTER — Ambulatory Visit: Payer: Medicaid Other

## 2021-01-10 ENCOUNTER — Encounter: Payer: Medicaid Other | Admitting: Physical Therapy

## 2021-01-17 ENCOUNTER — Encounter: Payer: Medicaid Other | Admitting: Physical Therapy

## 2021-12-23 ENCOUNTER — Ambulatory Visit: Payer: Self-pay | Admitting: Student

## 2022-01-14 ENCOUNTER — Encounter (HOSPITAL_COMMUNITY): Payer: Self-pay | Admitting: Student

## 2022-01-14 ENCOUNTER — Other Ambulatory Visit: Payer: Self-pay

## 2022-01-14 NOTE — Progress Notes (Signed)
Mr Jamill Wetmore denies chest pain or shortness of breath.  Patient denies having any s/s of Covid in his household.  Patient denies any known exposure to Covid.  ? ?PCP is Dr Ursula Alert [facility]. ?Pain Clinic is at [facility]. ? ?I instructed [place] to shower with antibacteria soap.   No nail polish, artificial or acrylic nails. Wear clean clothes, brush your teeth. ?Glasses, contact lens,dentures or partials may not be worn in the OR. If you need to wear them, please bring a case for glasses, do not wear contacts or bring a case, the hospital does not have contact cases, dentures or partials will have to be removed , make sure they are clean, we will provide a denture cup to put them in. You will need some one to drive you home and a responsible person over the age of 47 to stay with you for the first 24 hours after surgery.  ?
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
## 2022-01-14 NOTE — Anesthesia Preprocedure Evaluation (Addendum)
Anesthesia Evaluation  ?Patient identified by MRN, date of birth, ID band ?Patient awake ? ? ? ?Reviewed: ?Allergy & Precautions, NPO status , Patient's Chart, lab work & pertinent test results ? ?History of Anesthesia Complications ?Negative for: history of anesthetic complications ? ?Airway ?Mallampati: II ? ?TM Distance: >3 FB ?Neck ROM: Full ? ? ? Dental ? ?(+) Dental Advisory Given ?  ?Pulmonary ?Current Smoker and Patient abstained from smoking.,  ?  ?breath sounds clear to auscultation ? ? ? ? ? ? Cardiovascular ?negative cardio ROS ? ? ?Rhythm:Regular Rate:Normal ? ? ?  ?Neuro/Psych ?PSYCHIATRIC DISORDERS (ADHD) Anxiety Depression Bipolar Disorder negative neurological ROS ?   ? GI/Hepatic ?GERD  Controlled,(+)  ?  ? substance abuse (Suboxone) ? ,   ?Endo/Other  ?negative endocrine ROS ? Renal/GU ?negative Renal ROS  ? ?  ?Musculoskeletal ? ? Abdominal ?  ?Peds ? Hematology ?negative hematology ROS ?(+)   ?Anesthesia Other Findings ? ? Reproductive/Obstetrics ? ?  ? ? ? ? ? ? ? ? ? ? ? ? ? ?  ?  ? ? ? ? ? ? ? ?Anesthesia Physical ?Anesthesia Plan ? ?ASA: 2 ? ?Anesthesia Plan: General  ? ?Post-op Pain Management: Tylenol PO (pre-op)*  ? ?Induction: Intravenous ? ?PONV Risk Score and Plan: 1 and Ondansetron and Dexamethasone ? ?Airway Management Planned: LMA ? ?Additional Equipment: None ? ?Intra-op Plan:  ? ?Post-operative Plan:  ? ?Informed Consent: I have reviewed the patients History and Physical, chart, labs and discussed the procedure including the risks, benefits and alternatives for the proposed anesthesia with the patient or authorized representative who has indicated his/her understanding and acceptance.  ? ? ? ?Dental advisory given ? ?Plan Discussed with: CRNA and Surgeon ? ?Anesthesia Plan Comments:   ? ? ? ? ? ?Anesthesia Quick Evaluation ? ?

## 2022-01-15 ENCOUNTER — Other Ambulatory Visit: Payer: Self-pay

## 2022-01-15 ENCOUNTER — Encounter (HOSPITAL_COMMUNITY): Payer: Self-pay | Admitting: Student

## 2022-01-15 ENCOUNTER — Ambulatory Visit (HOSPITAL_BASED_OUTPATIENT_CLINIC_OR_DEPARTMENT_OTHER): Payer: Medicaid Other | Admitting: Anesthesiology

## 2022-01-15 ENCOUNTER — Ambulatory Visit (HOSPITAL_COMMUNITY)
Admission: RE | Admit: 2022-01-15 | Discharge: 2022-01-15 | Disposition: A | Discharge status: Home / Self Care | Payer: Medicaid Other | Source: Ambulatory Visit | Attending: Student | Admitting: Student

## 2022-01-15 ENCOUNTER — Ambulatory Visit (HOSPITAL_COMMUNITY): Payer: Medicaid Other | Admitting: Anesthesiology

## 2022-01-15 ENCOUNTER — Ambulatory Visit (HOSPITAL_COMMUNITY): Payer: Medicaid Other

## 2022-01-15 ENCOUNTER — Encounter (HOSPITAL_COMMUNITY)
Admission: RE | Disposition: A | Discharge status: Home / Self Care | Payer: Self-pay | Source: Ambulatory Visit | Attending: Student

## 2022-01-15 DIAGNOSIS — F319 Bipolar disorder, unspecified: Secondary | ICD-10-CM | POA: Insufficient documentation

## 2022-01-15 DIAGNOSIS — T8484XA Pain due to internal orthopedic prosthetic devices, implants and grafts, initial encounter: Secondary | ICD-10-CM

## 2022-01-15 DIAGNOSIS — R102 Pelvic and perineal pain: Secondary | ICD-10-CM | POA: Insufficient documentation

## 2022-01-15 DIAGNOSIS — K219 Gastro-esophageal reflux disease without esophagitis: Secondary | ICD-10-CM | POA: Diagnosis not present

## 2022-01-15 DIAGNOSIS — F17210 Nicotine dependence, cigarettes, uncomplicated: Secondary | ICD-10-CM | POA: Diagnosis not present

## 2022-01-15 DIAGNOSIS — X58XXXA Exposure to other specified factors, initial encounter: Secondary | ICD-10-CM | POA: Insufficient documentation

## 2022-01-15 HISTORY — DX: Attention-deficit hyperactivity disorder, unspecified type: F90.9

## 2022-01-15 HISTORY — DX: Unspecified intracranial injury with loss of consciousness of unspecified duration, initial encounter: S06.9X9A

## 2022-01-15 HISTORY — PX: HARDWARE REMOVAL: SHX979

## 2022-01-15 LAB — CBC
HCT: 40.2 % (ref 39.0–52.0)
Hemoglobin: 13.9 g/dL (ref 13.0–17.0)
MCH: 30.5 pg (ref 26.0–34.0)
MCHC: 34.6 g/dL (ref 30.0–36.0)
MCV: 88.4 fL (ref 80.0–100.0)
Platelets: 261 10*3/uL (ref 150–400)
RBC: 4.55 MIL/uL (ref 4.22–5.81)
RDW: 12.7 % (ref 11.5–15.5)
WBC: 6.9 10*3/uL (ref 4.0–10.5)
nRBC: 0 % (ref 0.0–0.2)

## 2022-01-15 SURGERY — REMOVAL, HARDWARE
Anesthesia: General | Laterality: Left

## 2022-01-15 MED ORDER — OXYCODONE HCL 5 MG PO TABS: 5.0000 mg | ORAL_TABLET | Freq: Once | ORAL | Status: DC | PRN

## 2022-01-15 MED ORDER — LACTATED RINGERS IV SOLN: INTRAVENOUS | Status: DC

## 2022-01-15 MED ORDER — MEPERIDINE HCL 25 MG/ML IJ SOLN
INTRAMUSCULAR | Status: AC
  Filled 2022-01-15: qty 1

## 2022-01-15 MED ORDER — CHLORHEXIDINE GLUCONATE 0.12 % MT SOLN
OROMUCOSAL | Status: AC
  Administered 2022-01-15: 15 mL via OROMUCOSAL
  Filled 2022-01-15: qty 15

## 2022-01-15 MED ORDER — MIDAZOLAM HCL 2 MG/2ML IJ SOLN
INTRAMUSCULAR | Status: AC
  Administered 2022-01-15: 0.5 mg
  Filled 2022-01-15: qty 2

## 2022-01-15 MED ORDER — HYDROMORPHONE HCL 1 MG/ML IJ SOLN
INTRAMUSCULAR | Status: AC
  Filled 2022-01-15: qty 1

## 2022-01-15 MED ORDER — MIDAZOLAM HCL 2 MG/2ML IJ SOLN
INTRAMUSCULAR | Status: AC
  Filled 2022-01-15: qty 2

## 2022-01-15 MED ORDER — FENTANYL CITRATE (PF) 250 MCG/5ML IJ SOLN
INTRAMUSCULAR | Status: DC | PRN
  Administered 2022-01-15: 25 ug via INTRAVENOUS
  Administered 2022-01-15 (×3): 50 ug via INTRAVENOUS
  Administered 2022-01-15 (×3): 25 ug via INTRAVENOUS

## 2022-01-15 MED ORDER — VANCOMYCIN HCL 1000 MG IV SOLR
INTRAVENOUS | Status: DC | PRN
  Administered 2022-01-15: 1000 mg

## 2022-01-15 MED ORDER — KETAMINE HCL 10 MG/ML IJ SOLN
INTRAMUSCULAR | Status: DC | PRN
  Administered 2022-01-15 (×2): 20 mg via INTRAVENOUS
  Administered 2022-01-15: 10 mg via INTRAVENOUS

## 2022-01-15 MED ORDER — DEXAMETHASONE SODIUM PHOSPHATE 10 MG/ML IJ SOLN
INTRAMUSCULAR | Status: AC
  Filled 2022-01-15: qty 1

## 2022-01-15 MED ORDER — CEFAZOLIN SODIUM-DEXTROSE 2-4 GM/100ML-% IV SOLN
INTRAVENOUS | Status: AC
  Filled 2022-01-15: qty 100

## 2022-01-15 MED ORDER — HYDROMORPHONE HCL 1 MG/ML IJ SOLN
0.2500 mg | INTRAMUSCULAR | Status: DC | PRN
  Administered 2022-01-15 (×4): 0.5 mg via INTRAVENOUS

## 2022-01-15 MED ORDER — HYDROMORPHONE HCL 1 MG/ML IJ SOLN
1.0000 mg | Freq: Once | INTRAMUSCULAR | Status: DC
Start: 2022-01-15 — End: 2022-01-15

## 2022-01-15 MED ORDER — CEFAZOLIN SODIUM-DEXTROSE 2-4 GM/100ML-% IV SOLN
2.0000 g | INTRAVENOUS | Status: AC
  Administered 2022-01-15: 2 g via INTRAVENOUS

## 2022-01-15 MED ORDER — PROPOFOL 10 MG/ML IV BOLUS
INTRAVENOUS | Status: DC | PRN
  Administered 2022-01-15: 200 mg via INTRAVENOUS

## 2022-01-15 MED ORDER — HYDROMORPHONE HCL 1 MG/ML IJ SOLN
INTRAMUSCULAR | Status: DC | PRN
  Administered 2022-01-15: .5 mg via INTRAVENOUS

## 2022-01-15 MED ORDER — LIDOCAINE 2% (20 MG/ML) 5 ML SYRINGE
INTRAMUSCULAR | Status: DC | PRN
  Administered 2022-01-15: 40 mg via INTRAVENOUS

## 2022-01-15 MED ORDER — OXYCODONE-ACETAMINOPHEN 5-325 MG PO TABS: 1.0000 | ORAL_TABLET | Freq: Four times a day (QID) | ORAL | 0 refills | Status: AC | PRN

## 2022-01-15 MED ORDER — KETOROLAC TROMETHAMINE 30 MG/ML IJ SOLN
INTRAMUSCULAR | Status: AC
  Filled 2022-01-15: qty 1

## 2022-01-15 MED ORDER — FENTANYL CITRATE (PF) 250 MCG/5ML IJ SOLN
INTRAMUSCULAR | Status: AC
  Filled 2022-01-15: qty 5

## 2022-01-15 MED ORDER — KETAMINE HCL 50 MG/5ML IJ SOSY
PREFILLED_SYRINGE | INTRAMUSCULAR | Status: AC
  Filled 2022-01-15: qty 5

## 2022-01-15 MED ORDER — CHLORHEXIDINE GLUCONATE 0.12 % MT SOLN: 15.0000 mL | Freq: Once | OROMUCOSAL | Status: AC

## 2022-01-15 MED ORDER — OXYCODONE HCL 5 MG/5ML PO SOLN: 5.0000 mg | Freq: Once | ORAL | Status: DC | PRN

## 2022-01-15 MED ORDER — MEPERIDINE HCL 25 MG/ML IJ SOLN
6.2500 mg | INTRAMUSCULAR | Status: DC | PRN
  Administered 2022-01-15: 12.5 mg via INTRAVENOUS

## 2022-01-15 MED ORDER — BUPIVACAINE-EPINEPHRINE 0.25% -1:200000 IJ SOLN
INTRAMUSCULAR | Status: DC | PRN
  Administered 2022-01-15: 18 mL

## 2022-01-15 MED ORDER — KETOROLAC TROMETHAMINE 30 MG/ML IJ SOLN
INTRAMUSCULAR | Status: DC | PRN
  Administered 2022-01-15: 30 mg via INTRAVENOUS

## 2022-01-15 MED ORDER — VANCOMYCIN HCL 1000 MG IV SOLR
INTRAVENOUS | Status: AC
  Filled 2022-01-15: qty 20

## 2022-01-15 MED ORDER — ACETAMINOPHEN 500 MG PO TABS: 1000.0000 mg | ORAL_TABLET | Freq: Once | ORAL | Status: AC

## 2022-01-15 MED ORDER — LIDOCAINE 2% (20 MG/ML) 5 ML SYRINGE
INTRAMUSCULAR | Status: AC
  Filled 2022-01-15: qty 5

## 2022-01-15 MED ORDER — PROPOFOL 10 MG/ML IV BOLUS
INTRAVENOUS | Status: AC
  Filled 2022-01-15: qty 20

## 2022-01-15 MED ORDER — DEXAMETHASONE SODIUM PHOSPHATE 10 MG/ML IJ SOLN
INTRAMUSCULAR | Status: DC | PRN
  Administered 2022-01-15: 10 mg via INTRAVENOUS

## 2022-01-15 MED ORDER — ORAL CARE MOUTH RINSE: 15.0000 mL | Freq: Once | OROMUCOSAL | Status: AC

## 2022-01-15 MED ORDER — 0.9 % SODIUM CHLORIDE (POUR BTL) OPTIME
TOPICAL | Status: DC | PRN
  Administered 2022-01-15: 1000 mL

## 2022-01-15 MED ORDER — HYDROMORPHONE HCL 1 MG/ML IJ SOLN
INTRAMUSCULAR | Status: AC
  Filled 2022-01-15: qty 0.5

## 2022-01-15 MED ORDER — MIDAZOLAM HCL 2 MG/2ML IJ SOLN
INTRAMUSCULAR | Status: DC | PRN
  Administered 2022-01-15: 2 mg via INTRAVENOUS

## 2022-01-15 MED ORDER — MIDAZOLAM HCL 2 MG/2ML IJ SOLN
1.0000 mg | Freq: Once | INTRAMUSCULAR | Status: AC
  Administered 2022-01-15: 1 mg via INTRAVENOUS

## 2022-01-15 MED ORDER — BUPIVACAINE-EPINEPHRINE (PF) 0.25% -1:200000 IJ SOLN
INTRAMUSCULAR | Status: AC
  Filled 2022-01-15: qty 30

## 2022-01-15 MED ORDER — ONDANSETRON HCL 4 MG/2ML IJ SOLN
INTRAMUSCULAR | Status: DC | PRN
  Administered 2022-01-15: 4 mg via INTRAVENOUS

## 2022-01-15 MED ORDER — ACETAMINOPHEN 500 MG PO TABS
ORAL_TABLET | ORAL | Status: AC
  Administered 2022-01-15: 1000 mg via ORAL
  Filled 2022-01-15: qty 2

## 2022-01-15 MED ORDER — ONDANSETRON HCL 4 MG/2ML IJ SOLN
INTRAMUSCULAR | Status: AC
  Filled 2022-01-15: qty 2

## 2022-01-15 MED ORDER — MIDAZOLAM HCL 2 MG/2ML IJ SOLN
0.5000 mg | Freq: Once | INTRAMUSCULAR | Status: AC | PRN
  Administered 2022-01-15: 1 mg via INTRAVENOUS

## 2022-01-15 SURGICAL SUPPLY — 45 items
BAG COUNTER SPONGE SURGICOUNT (BAG) ×2 IMPLANT
BNDG ELASTIC 4X5.8 VLCR STR LF (GAUZE/BANDAGES/DRESSINGS) ×2 IMPLANT
BNDG ELASTIC 6X5.8 VLCR STR LF (GAUZE/BANDAGES/DRESSINGS) ×2 IMPLANT
BRUSH SCRUB EZ PLAIN DRY (MISCELLANEOUS) ×4 IMPLANT
CHLORAPREP W/TINT 26 (MISCELLANEOUS) ×2 IMPLANT
COVER SURGICAL LIGHT HANDLE (MISCELLANEOUS) ×4 IMPLANT
DERMABOND ADVANCED (GAUZE/BANDAGES/DRESSINGS) ×2
DERMABOND ADVANCED .7 DNX12 (GAUZE/BANDAGES/DRESSINGS) IMPLANT
DRAPE C-ARM 42X72 X-RAY (DRAPES) ×1 IMPLANT
DRAPE C-ARMOR (DRAPES) ×2 IMPLANT
DRAPE INCISE IOBAN 66X45 STRL (DRAPES) ×2 IMPLANT
DRAPE U-SHAPE 47X51 STRL (DRAPES) ×2 IMPLANT
DRSG ADAPTIC 3X8 NADH LF (GAUZE/BANDAGES/DRESSINGS) ×1 IMPLANT
DRSG MEPILEX BORDER 4X8 (GAUZE/BANDAGES/DRESSINGS) ×2 IMPLANT
ELECT REM PT RETURN 9FT ADLT (ELECTROSURGICAL) ×2
ELECTRODE REM PT RTRN 9FT ADLT (ELECTROSURGICAL) ×1 IMPLANT
GAUZE SPONGE 4X4 12PLY STRL (GAUZE/BANDAGES/DRESSINGS) ×1 IMPLANT
GLOVE SURG ENC MOIS LTX SZ6.5 (GLOVE) ×6 IMPLANT
GLOVE SURG ENC MOIS LTX SZ7.5 (GLOVE) ×8 IMPLANT
GLOVE SURG UNDER POLY LF SZ6.5 (GLOVE) ×2 IMPLANT
GLOVE SURG UNDER POLY LF SZ7.5 (GLOVE) ×2 IMPLANT
GOWN STRL REUS W/ TWL LRG LVL3 (GOWN DISPOSABLE) ×2 IMPLANT
GOWN STRL REUS W/TWL LRG LVL3 (GOWN DISPOSABLE) ×2
KIT BASIN OR (CUSTOM PROCEDURE TRAY) ×2 IMPLANT
KIT TURNOVER KIT B (KITS) ×2 IMPLANT
MANIFOLD NEPTUNE II (INSTRUMENTS) ×2 IMPLANT
NEEDLE 22X1 1/2 (OR ONLY) (NEEDLE) IMPLANT
NS IRRIG 1000ML POUR BTL (IV SOLUTION) ×2 IMPLANT
PACK ORTHO EXTREMITY (CUSTOM PROCEDURE TRAY) ×2 IMPLANT
PAD ARMBOARD 7.5X6 YLW CONV (MISCELLANEOUS) ×4 IMPLANT
SPONGE T-LAP 18X18 ~~LOC~~+RFID (SPONGE) ×2 IMPLANT
STAPLER VISISTAT 35W (STAPLE) ×1 IMPLANT
STRIP CLOSURE SKIN 1/2X4 (GAUZE/BANDAGES/DRESSINGS) IMPLANT
SUT MNCRL AB 3-0 PS2 18 (SUTURE) ×3 IMPLANT
SUT MON AB 2-0 CT1 36 (SUTURE) ×2 IMPLANT
SUT VIC AB 0 CT1 27 (SUTURE) ×2
SUT VIC AB 0 CT1 27XBRD ANBCTR (SUTURE) IMPLANT
SUT VIC AB 2-0 CT1 27 (SUTURE) ×2
SUT VIC AB 2-0 CT1 TAPERPNT 27 (SUTURE) IMPLANT
TOWEL GREEN STERILE (TOWEL DISPOSABLE) ×4 IMPLANT
TOWEL GREEN STERILE FF (TOWEL DISPOSABLE) ×4 IMPLANT
TUBE CONNECTING 12X1/4 (SUCTIONS) ×2 IMPLANT
UNDERPAD 30X36 HEAVY ABSORB (UNDERPADS AND DIAPERS) ×2 IMPLANT
WATER STERILE IRR 1000ML POUR (IV SOLUTION) ×4 IMPLANT
YANKAUER SUCT BULB TIP NO VENT (SUCTIONS) ×2 IMPLANT

## 2022-01-15 NOTE — Transfer of Care (Signed)
Immediate Anesthesia Transfer of Care Note ? ?Patient: Mike [name] ? ?Procedure(s) Performed: HARDWARE REMOVAL PELVIS (Left) ? ?Patient Location: PACU ? ?Anesthesia Type:General ? ?Level of Consciousness: responds to stimulation ? ?Airway & Oxygen Therapy: Patient Spontanous Breathing and Patient connected to nasal cannula oxygen , OPA ? ?Post-op Assessment: Report given to RN and Post -op Vital signs reviewed and stable ? ?Post vital signs: Reviewed and stable ? ?Last Vitals:  ?Vitals Value Taken Time  ?BP 123/86 01/15/22 0921  ?Temp    ?Pulse 76 01/15/22 0925  ?Resp 17 01/15/22 0925  ?SpO2 98 % 01/15/22 0925  ?Vitals shown include unvalidated device data. ? ?Last Pain:  ?Vitals:  ? 01/15/22 0638  ?TempSrc:   ?PainSc: 6   ?   ? ?Patients Stated Pain Goal: 2 (01/15/22 8891) ? ?Complications: No notable events documented. ?

## 2022-01-15 NOTE — Anesthesia Postprocedure Evaluation (Signed)
Anesthesia Post Note ? ?Patient: Mike [name] ? ?Procedure(s) Performed: HARDWARE REMOVAL PELVIS (Left) ? ?  ? ?Patient location during evaluation: PACU ?Anesthesia Type: General ?Level of consciousness: sedated, oriented and patient cooperative ?Pain management: pain level controlled (pt much more comfortable) ?Vital Signs Assessment: post-procedure vital signs reviewed and stable ?Respiratory status: spontaneous breathing, nonlabored ventilation and respiratory function stable ?Cardiovascular status: blood pressure returned to baseline and stable ?Postop Assessment: no apparent nausea or vomiting ?Anesthetic complications: no ? ? ?No notable events documented. ? ?Last Vitals:  ?Vitals:  ? 01/15/22 1035 01/15/22 1050  ?BP: 122/74 118/66  ?Pulse: 86 84  ?Resp: 17 15  ?Temp:    ?SpO2: 95% 96%  ?  ?Last Pain:  ?Vitals:  ? 01/15/22 1050  ?TempSrc:   ?PainSc: Asleep  ? ? ?  ?  ?  ?  ?  ?  ? ?Kalianna Verbeke,E. Rex Oesterle ? ? ? ? ?

## 2022-01-15 NOTE — Interval H&P Note (Signed)
History and Physical Interval Note: ? ?01/15/2022 ?7:24 AM ? ?Mike [name]  has presented today for surgery, with the diagnosis of Painful orthopaedic hardware.  The various methods of treatment have been discussed with the patient and family. After consideration of risks, benefits and other options for treatment, the patient has consented to  Procedure(s): ?HARDWARE REMOVAL PELVIS (Left) as a surgical intervention.  The patient's history has been reviewed, patient examined, no change in status, stable for surgery.  I have reviewed the patient's chart and labs.  Questions were answered to the patient's satisfaction.   ? ? ?Caryn Bee P Korissa Horsford ? ? ?
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
## 2022-01-15 NOTE — Anesthesia Procedure Notes (Addendum)
Procedure Name: LMA Insertion ?Date/Time: 01/15/2022 7:35 AM ?Performed by: Betha Loa, CRNA ?Pre-anesthesia Checklist: Patient identified, Emergency Drugs available, Suction available and Patient being monitored ?Patient Re-evaluated:Patient Re-evaluated prior to induction ?Oxygen Delivery Method: Circle System Utilized ?Preoxygenation: Pre-oxygenation with 100% oxygen ?Induction Type: IV induction ?Ventilation: Mask ventilation without difficulty ?LMA: LMA inserted ?LMA Size: 5.0 ?Number of attempts: 1 ?Placement Confirmation: positive ETCO2 and breath sounds checked- equal and bilateral ?Tube secured with: Tape ?Dental Injury: Teeth and Oropharynx as per pre-operative assessment  ? ? ? ? ?

## 2022-01-15 NOTE — Discharge Instructions (Signed)
? ?Orthopaedic Trauma Service Discharge Instructions ? ? ?General Discharge Instructions ? ?WEIGHT BEARING STATUS: Weightbearing as tolerated ? ?RANGE OF MOTION/ACTIVITY:ok for unrestricted hip motion as tolerated ? ?Wound Care: You may remove your surgical dressing on post-op day #2 (Friday 01/17/22).  Incisions can be left open to air if there is no drainage. Okay to shower if no drainage from incisions. ? ?DVT/PE prophylaxis: None ? ?Diet: as you were eating previously.  Can use over the counter stool softeners and bowel preparations, such as Miralax, to help with bowel movements.  Narcotics can be constipating.  Be sure to drink plenty of fluids ? ?PAIN MEDICATION USE AND EXPECTATIONS ? You have likely been given narcotic medications to help control your pain.  After a traumatic event that results in an fracture (broken bone) with or without surgery, it is ok to use narcotic pain medications to help control one's pain.  We understand that everyone responds to pain differently and each individual patient will be evaluated on a regular basis for the continued need for narcotic medications. Ideally, narcotic medication use should last no more than 6-8 weeks (coinciding with fracture healing).  ? As a patient it is your responsibility as well to monitor narcotic medication use and report the amount and frequency you use these medications when you come to your office visit.  ? We would also advise that if you are using narcotic medications, you should take a dose prior to therapy to maximize you participation. ? ?IF YOU ARE ON NARCOTIC MEDICATIONS IT IS NOT PERMISSIBLE TO OPERATE A MOTOR VEHICLE (MOTORCYCLE/CAR/TRUCK/MOPED) OR HEAVY MACHINERY ?DO NOT MIX NARCOTICS WITH OTHER CNS ([place]) DEPRESSANTS SUCH AS ALCOHOL ? ? ?STOP SMOKING OR USING NICOTINE PRODUCTS!!!! ? As discussed nicotine severely impairs your body's ability to heal surgical and traumatic wounds but also impairs bone healing.  Wounds  and bone heal by forming microscopic blood vessels (angiogenesis) and nicotine is a vasoconstrictor (essentially, shrinks blood vessels).  Therefore, if vasoconstriction occurs to these microscopic blood vessels they essentially disappear and are unable to deliver necessary nutrients to the healing tissue.  This is one modifiable factor that you can do to dramatically increase your chances of healing your injury.   ? (This means no smoking, no nicotine gum, patches, etc) ? ?DO NOT USE NONSTEROIDAL ANTI-INFLAMMATORY DRUGS (NSAID'S) ? Using products such as Advil (ibuprofen), Aleve (naproxen), Motrin (ibuprofen) for additional pain control during fracture healing can delay and/or prevent the healing response.  If you would like to take over the counter (OTC) medication, Tylenol (acetaminophen) is ok.  However, some narcotic medications that are given for pain control contain acetaminophen as well. Therefore, you should not exceed more than 4000 mg of tylenol in a day if you do not have liver disease.  Also note that there are may OTC medicines, such as cold medicines and allergy medicines that my contain tylenol as well.  If you have any questions about medications and/or interactions please ask your doctor/PA or your pharmacist.  ?   ? ?ICE AND ELEVATE INJURED/OPERATIVE EXTREMITY ? Using ice and elevating the injured extremity above your heart can help with swelling and pain control.  Icing in a pulsatile fashion, such as 20 minutes on and 20 minutes off, can be followed.   ? Do not place ice directly on skin. Make sure there is a barrier between to skin and the ice pack.   ? Using frozen items such as frozen peas works well as the  conform nicely to the are that needs to be iced. ? ?USE AN ACE WRAP OR TED HOSE FOR SWELLING CONTROL ? In addition to icing and elevation, Ace wraps or TED hose are used to help limit and resolve swelling.  It is recommended to use Ace wraps or TED hose until you are informed to stop.    ? When using Ace Wraps start the wrapping distally (farthest away from the body) and wrap proximally (closer to the body) ?  Example: If you had surgery on your leg or thing and you do not have a splint on, start the ace wrap at the toes and work your way up to the thigh ?       If you had surgery on your upper extremity and do not have a splint on, start the ace wrap at your fingers and work your way up to the upper arm ? ? ?CALL THE OFFICE WITH ANY QUESTIONS OR CONCERNS: [phone]  ? ?VISIT OUR WEBSITE FOR ADDITIONAL INFORMATION: [email or web address] ?  ? ?Discharge Wound Care Instructions ? ?Do NOT apply any ointments, solutions or lotions to pin sites or surgical wounds.  These prevent needed drainage and even though solutions like hydrogen peroxide kill bacteria, they also damage cells lining the pin sites that help fight infection.  Applying lotions or ointments can keep the wounds moist and can cause them to breakdown and open up as well. This can increase the risk for infection. When in doubt call the office. ? ?Once the incision is completely dry and without drainage, it may be left open to air out.  Showering may begin 36-48 hours later.  Cleaning gently with soap and water. ? ?

## 2022-01-15 NOTE — OR Nursing (Signed)
All remaining hardware removed from patients pelvis. Patient wanted to keep hardware. 2 plates, 10 screws cleaned and  placed in biohazard bag with patient label. 1 screw did not go with patient because it was broken. ?

## 2022-01-16 ENCOUNTER — Encounter (HOSPITAL_COMMUNITY): Payer: Self-pay | Admitting: Student

## 2023-06-23 ENCOUNTER — Other Ambulatory Visit: Payer: Self-pay | Admitting: Orthopaedic Surgery

## 2023-06-23 DIAGNOSIS — M25571 Pain in right ankle and joints of right foot: Secondary | ICD-10-CM

## 2023-06-29 ENCOUNTER — Other Ambulatory Visit: Payer: Medicaid Other

## 2023-07-14 ENCOUNTER — Ambulatory Visit
Admission: RE | Admit: 2023-07-14 | Discharge: 2023-07-14 | Disposition: A | Discharge status: Home / Self Care | Payer: Medicaid Other | Source: Ambulatory Visit | Attending: Orthopaedic Surgery | Admitting: Orthopaedic Surgery

## 2023-07-14 DIAGNOSIS — M25571 Pain in right ankle and joints of right foot: Secondary | ICD-10-CM
